# Patient Record
Sex: Male | Born: 1955 | Race: White | Hispanic: No | Marital: Married | Smoking: Never smoker
Health system: Southern US, Community
[De-identification: ages and names within clinical notes are randomized; demographics above are authoritative.]

## PROBLEM LIST (undated history)

## (undated) DIAGNOSIS — Z808 Family history of malignant neoplasm of other organs or systems: Secondary | ICD-10-CM

## (undated) DIAGNOSIS — M1711 Unilateral primary osteoarthritis, right knee: Secondary | ICD-10-CM

## (undated) DIAGNOSIS — E119 Type 2 diabetes mellitus without complications: Secondary | ICD-10-CM

## (undated) DIAGNOSIS — N200 Calculus of kidney: Secondary | ICD-10-CM

## (undated) DIAGNOSIS — M7710 Lateral epicondylitis, unspecified elbow: Secondary | ICD-10-CM

## (undated) DIAGNOSIS — Z800 Family history of malignant neoplasm of digestive organs: Secondary | ICD-10-CM

## (undated) DIAGNOSIS — M1990 Unspecified osteoarthritis, unspecified site: Secondary | ICD-10-CM

## (undated) DIAGNOSIS — E291 Testicular hypofunction: Secondary | ICD-10-CM

## (undated) DIAGNOSIS — E7800 Pure hypercholesterolemia, unspecified: Secondary | ICD-10-CM

## (undated) DIAGNOSIS — H9191 Unspecified hearing loss, right ear: Secondary | ICD-10-CM

## (undated) DIAGNOSIS — E663 Overweight: Secondary | ICD-10-CM

## (undated) DIAGNOSIS — I10 Essential (primary) hypertension: Secondary | ICD-10-CM

## (undated) HISTORY — DX: Unilateral primary osteoarthritis, right knee: M17.11

## (undated) HISTORY — DX: Unspecified hearing loss, right ear: H91.91

## (undated) HISTORY — DX: Essential (primary) hypertension: I10

## (undated) HISTORY — DX: Testicular hypofunction: E29.1

## (undated) HISTORY — DX: Family history of malignant neoplasm of other organs or systems: Z80.8

## (undated) HISTORY — PX: SHOULDER SURGERY: SHX246

## (undated) HISTORY — PX: LITHOTRIPSY: SUR834

## (undated) HISTORY — DX: Calculus of kidney: N20.0

## (undated) HISTORY — PX: ANAL FISSURE REPAIR: SHX2312

## (undated) HISTORY — DX: Family history of malignant neoplasm of digestive organs: Z80.0

## (undated) HISTORY — DX: Pure hypercholesterolemia, unspecified: E78.00

## (undated) HISTORY — DX: Unspecified osteoarthritis, unspecified site: M19.90

## (undated) HISTORY — DX: Type 2 diabetes mellitus without complications: E11.9

## (undated) HISTORY — DX: Overweight: E66.3

## (undated) HISTORY — DX: Lateral epicondylitis, unspecified elbow: M77.10

## (undated) HISTORY — PX: EYE SURGERY: SHX253

---

## 1988-12-17 HISTORY — PX: INGUINAL HERNIA REPAIR: SUR1180

## 1990-12-17 HISTORY — PX: LUNG REMOVAL, PARTIAL: SHX233

## 2005-07-20 ENCOUNTER — Ambulatory Visit: Payer: Self-pay | Admitting: General Surgery

## 2005-09-20 ENCOUNTER — Ambulatory Visit: Payer: Self-pay | Admitting: Urology

## 2011-08-20 ENCOUNTER — Emergency Department: Payer: Self-pay | Admitting: Unknown Physician Specialty

## 2015-08-17 ENCOUNTER — Other Ambulatory Visit: Payer: Self-pay | Admitting: Physician Assistant

## 2015-08-17 DIAGNOSIS — R319 Hematuria, unspecified: Secondary | ICD-10-CM

## 2015-08-17 DIAGNOSIS — R109 Unspecified abdominal pain: Secondary | ICD-10-CM

## 2015-08-17 DIAGNOSIS — Z87442 Personal history of urinary calculi: Secondary | ICD-10-CM

## 2015-08-18 ENCOUNTER — Ambulatory Visit
Admission: RE | Admit: 2015-08-18 | Discharge: 2015-08-18 | Disposition: A | Discharge status: Home / Self Care | Payer: BLUE CROSS/BLUE SHIELD | Source: Ambulatory Visit | Attending: Physician Assistant | Admitting: Physician Assistant

## 2015-08-18 DIAGNOSIS — N200 Calculus of kidney: Secondary | ICD-10-CM | POA: Insufficient documentation

## 2015-08-18 DIAGNOSIS — R319 Hematuria, unspecified: Secondary | ICD-10-CM | POA: Insufficient documentation

## 2015-08-18 DIAGNOSIS — K760 Fatty (change of) liver, not elsewhere classified: Secondary | ICD-10-CM | POA: Insufficient documentation

## 2015-08-18 DIAGNOSIS — Z87442 Personal history of urinary calculi: Secondary | ICD-10-CM

## 2015-08-18 DIAGNOSIS — R109 Unspecified abdominal pain: Secondary | ICD-10-CM | POA: Diagnosis present

## 2015-08-31 ENCOUNTER — Ambulatory Visit (INDEPENDENT_AMBULATORY_CARE_PROVIDER_SITE_OTHER): Payer: BLUE CROSS/BLUE SHIELD | Admitting: Obstetrics and Gynecology

## 2015-08-31 ENCOUNTER — Encounter: Payer: Self-pay | Admitting: Obstetrics and Gynecology

## 2015-08-31 ENCOUNTER — Ambulatory Visit: Payer: Self-pay

## 2015-08-31 VITALS — BP 155/91 | HR 65 | Ht 70.0 in | Wt 229.9 lb

## 2015-08-31 DIAGNOSIS — I159 Secondary hypertension, unspecified: Secondary | ICD-10-CM | POA: Diagnosis not present

## 2015-08-31 DIAGNOSIS — N200 Calculus of kidney: Secondary | ICD-10-CM

## 2015-08-31 LAB — URINALYSIS, COMPLETE
Bilirubin, UA: NEGATIVE
Glucose, UA: NEGATIVE
Ketones, UA: NEGATIVE
NITRITE UA: NEGATIVE
PH UA: 5 (ref 5.0–7.5)
Specific Gravity, UA: 1.025 (ref 1.005–1.030)
Urobilinogen, Ur: 0.2 mg/dL (ref 0.2–1.0)

## 2015-08-31 LAB — MICROSCOPIC EXAMINATION

## 2015-08-31 NOTE — Progress Notes (Signed)
08/31/2015 8:46 AM   Billy Scott 01/18/1956 [id number]  Referring provider: Jabier Mutton, MD [facility] [address] [place], [place] [address]  Chief Complaint  Patient presents with  . Nephrolithiasis    new pt    HPI: Billy Scott is a 59 year old male with a history of kidney stones presenting today with complaints of bilateral flank pain left greater than right and very dark urine. He was seen by the city nurse and a CT scan without contrast was ordered remarkable for a large left renal stone without signs of significant obstruction as well as a small 2 mm right upper pole calculus.  Patient denies any urinary symptoms except for extremely dark urine which I suspect is gross hematuria.  Patient states he is a previous patient of Billy Scott.  Stone history includes 3 prior episodes. He has required ESWL in the past.  No family history of stones. Patient reports very little if any daily water intake.   CT abd/pelvis wo 08/18/15 1.8 x 0.9 x 2.7 cm calculus in the left renal pelvis. There is some mild fullness of the left renal pelvis but no frank hydronephrosis. There is also some inflammatory changes in the peripelvic fat. 2. 2 mm nonobstructive calculus in the upper pole collecting system of the right kidney. 3. No ureteral stones or findings of urinary tract obstruction at this time.   PMH: Past Medical History  Diagnosis Date  . Kidney stone   . Arthritis   . Diabetes mellitus without complication   . Hypertension     Surgical History: Past Surgical History  Procedure Laterality Date  . Lung removal, partial Right 1992  . Inguinal hernia repair Right 1990  . Lithotripsy      Home Medications:    Medication List       This list is accurate as of: 08/31/15 11:59 PM.  Always use your most recent med list.               bisoprolol-hydrochlorothiazide 5-6.25 MG per tablet  Commonly known as:  ZIAC  Take 1 tablet by mouth daily.       KOMBIGLYZE XR 04-999 MG Tb24  Generic drug:  Saxagliptin-Metformin  Take 1 tablet by mouth every morning.        Allergies:  Allergies  Allergen Reactions  . Penicillins Hives    Family History: Family History  Problem Relation Age of Onset  . Cancer Father     esophageal  . Cancer Mother     liver  . Urolithiasis Neg Hx   . Prostate cancer Neg Hx   . Kidney disease Neg Hx   . Kidney cancer Neg Hx     Social History:  reports that he has never smoked. He does not have any smokeless tobacco history on file. He reports that he drinks about 0.6 oz of alcohol per week. He reports that he does not use illicit drugs.  ROS: UROLOGY Frequent Urination?: No Hard to postpone urination?: No Burning/pain with urination?: No Get up at night to urinate?: Yes Leakage of urine?: No Urine stream starts and stops?: No Trouble starting stream?: No Do you have to strain to urinate?: No Blood in urine?: Yes Urinary tract infection?: No Sexually transmitted disease?: No Injury to kidneys or bladder?: No Painful intercourse?: No Weak stream?: No Erection problems?: Yes Penile pain?: No  Gastrointestinal Nausea?: No Vomiting?: No Indigestion/heartburn?: No Diarrhea?: Yes Constipation?: No  Constitutional Fever: No Night sweats?: Yes  Weight loss?: No Fatigue?: No  Skin Skin rash/lesions?: No Itching?: No  Eyes Blurred vision?: No Double vision?: No  Ears/Nose/Throat Sore throat?: No Sinus problems?: No  Hematologic/Lymphatic Swollen glands?: No Easy bruising?: No  Cardiovascular Leg swelling?: No Chest pain?: No  Respiratory Cough?: No Shortness of breath?: No     Musculoskeletal Back pain?: No Joint pain?: Yes  Neurological Headaches?: No Dizziness?: No  Psychologic Depression?: No Anxiety?: No  Physical Exam: BP 155/91 mmHg  Pulse 65  Ht  (1.778 m)  Wt 229 lb 14.4 oz (104.282 kg)  BMI 32.99 kg/m2  Constitutional:  Alert and  oriented, No acute distress. HEENT: [place] AT, moist mucus membranes.  Trachea midline, no masses. Cardiovascular: No clubbing, cyanosis, or edema. Respiratory: Normal respiratory effort, no increased work of breathing. GI: Abdomen is soft, nontender, nondistended, no abdominal masses GU: Mild left CVA tenderness.  Skin: No rashes, bruises or suspicious lesions. Lymph: No cervical or inguinal adenopathy. Neurologic: Grossly intact, no focal deficits, moving all 4 extremities. Psychiatric: Normal mood and affect.  Laboratory Data: Lab Results  Component Value Date   WBC 10.5 08/31/2015   HCT 43.2 08/31/2015    Lab Results  Component Value Date   CREATININE 0.97 08/31/2015    No results found for: PSA  No results found for: TESTOSTERONE  No results found for: HGBA1C  Urinalysis    Component Value Date/Time   GLUCOSEU Negative 08/31/2015 1013   BILIRUBINUR Negative 08/31/2015 1013   NITRITE Negative 08/31/2015 1013   LEUKOCYTESUR 1+* 08/31/2015 1013    Pertinent Imaging: CLINICAL DATA: 59 year old male with bilateral flank pain (left greater than right). Microhematuria.  EXAM: CT ABDOMEN AND PELVIS WITHOUT CONTRAST  TECHNIQUE: Multidetector CT imaging of the abdomen and pelvis was performed following the standard protocol without IV contrast.  COMPARISON: CT the abdomen and pelvis 08/20/2011.  FINDINGS: Lower chest: 5 mm subpleural nodule in the inferior aspect of the right lower lobe (image 9 of series 4), unchanged compared to prior study 08/20/2011, presumably a subpleural lymph node (considered benign).  Hepatobiliary: Diffuse low attenuation throughout the hepatic parenchyma, compatible with hepatic steatosis. No definite cystic or solid hepatic lesions are identified on today's noncontrast CT examination. The unenhanced appearance of the gallbladder is normal.  Pancreas: No pancreatic mass or peripancreatic inflammatory changes on today's  noncontrast CT examination.  Spleen: Unremarkable.  Adrenals/Urinary Tract: In the left renal pelvis there is a 1.8 x 0.9 x 2.7 cm calculus. This is associated with some mild fullness of the left renal pelvis, but no frank hydronephrosis. Slight haziness in the peripelvic fat suggests associated inflammation. 2 mm nonobstructive calculus in the upper pole collecting system of the right kidney. No additional calculi are identified along the course of either ureter or within the lumen of the urinary bladder. No hydroureteronephrosis. The unenhanced appearance of the kidneys and urinary bladder is otherwise normal. Bilateral adrenal glands are normal in appearance.  Stomach/Bowel: The unenhanced appearance of the stomach is normal. No pathologic dilatation of small bowel or colon. Normal appendix.  Vascular/Lymphatic: Minimal atherosclerotic calcifications are identified in the abdominal and pelvic vasculature, without evidence of aneurysm. No lymphadenopathy noted in the abdomen or pelvis on today's noncontrast CT examination.  Reproductive: Prostate gland seminal vesicles are unremarkable in appearance.  Other: Small left inguinal hernia containing fat. No significant volume of ascites. No pneumoperitoneum.  Musculoskeletal: There are no aggressive appearing lytic or blastic lesions noted in the visualized portions of the skeleton.  IMPRESSION:  1. 1.8 x 0.9 x 2.7 cm calculus in the left renal pelvis. There is some mild fullness of the left renal pelvis but no frank hydronephrosis. There is also some inflammatory changes in the peripelvic fat. 2. 2 mm nonobstructive calculus in the upper pole collecting system of the right kidney. 3. No ureteral stones or findings of urinary tract obstruction at this time. 4. Normal appendix. 5. Hepatic steatosis. 6. Additional incidental findings, as above.  Assessment & Plan:   1. Kidney stones We discussed various treatment  options including PCNL vs ESWL vs. ureteroscopy, laser lithotripsy, and stent. We discussed the risks and benefits of all including bleeding, infection, damage to surrounding structures, efficacy with need for possible further intervention, and need for temporary ureteral stent. Given the stones location and large size I recommend inpatient proceed with PCNL. Images were reviewed with Dr. Sherryl Barters and he is in agreement with plan. Will schedule left PCNL with cystoscopy and bilateral retrogrades. Patient to follow-up after surgery to discuss stone analysis and stone prevention strategies. - Urinalysis, Complete  2. Gross hematuria-  Cystoscopy with bilateral retrogrades to be performed during PCNL for completion of hematuria workup.  2. Prostate Cancer Screening- Will discuss prostate cancer screening once stone is addressed.   Return for f/u post op to discuss stone analysis.  Billy Lou, FNP  [facility] [address] [place], [place] [address] [phone]

## 2015-09-01 DIAGNOSIS — I10 Essential (primary) hypertension: Secondary | ICD-10-CM | POA: Insufficient documentation

## 2015-09-01 DIAGNOSIS — E119 Type 2 diabetes mellitus without complications: Secondary | ICD-10-CM | POA: Insufficient documentation

## 2015-09-01 LAB — BASIC METABOLIC PANEL
BUN / CREAT RATIO: 15 (ref 9–20)
BUN: 15 mg/dL (ref 6–24)
CALCIUM: 9.8 mg/dL (ref 8.7–10.2)
CHLORIDE: 98 mmol/L (ref 97–108)
CO2: 24 mmol/L (ref 18–29)
Creatinine, Ser: 0.97 mg/dL (ref 0.76–1.27)
GFR, EST AFRICAN AMERICAN: 98 mL/min/{1.73_m2} (ref >59)
GFR, EST NON AFRICAN AMERICAN: 85 mL/min/{1.73_m2} (ref >59)
Glucose: 222 mg/dL — ABNORMAL HIGH (ref 65–99)
POTASSIUM: 4.7 mmol/L (ref 3.5–5.2)
Sodium: 140 mmol/L (ref 134–144)

## 2015-09-01 LAB — CBC WITH DIFFERENTIAL/PLATELET
BASOS: 0 %
Basophils Absolute: 0 10*3/uL (ref 0.0–0.2)
EOS (ABSOLUTE): 0.2 10*3/uL (ref 0.0–0.4)
Eos: 2 %
HEMOGLOBIN: 15 g/dL (ref 12.6–17.7)
Hematocrit: 43.2 % (ref 37.5–51.0)
IMMATURE GRANS (ABS): 0 10*3/uL (ref 0.0–0.1)
Immature Granulocytes: 0 %
LYMPHS ABS: 2 10*3/uL (ref 0.7–3.1)
LYMPHS: 19 %
MCH: 28.2 pg (ref 26.6–33.0)
MCHC: 34.7 g/dL (ref 31.5–35.7)
MCV: 81 fL (ref 79–97)
MONOCYTES: 8 %
Monocytes Absolute: 0.9 10*3/uL (ref 0.1–0.9)
NEUTROS ABS: 7.4 10*3/uL — AB (ref 1.4–7.0)
Neutrophils: 71 %
Platelets: 213 10*3/uL (ref 150–379)
RBC: 5.31 x10E6/uL (ref 4.14–5.80)
RDW: 13.7 % (ref 12.3–15.4)
WBC: 10.5 10*3/uL (ref 3.4–10.8)

## 2015-09-02 ENCOUNTER — Telehealth: Payer: Self-pay

## 2015-09-02 NOTE — Telephone Encounter (Signed)
-----   Message from Fernanda Drum, FNP sent at 09/01/2015  1:01 PM EDT ----- Please notify patient that his lab results came back within normal limits. Please ask him if he knows when he will be back from vacation yet and we can begin scheduling his surgery. Thank you

## 2015-09-02 NOTE — Telephone Encounter (Signed)
LMOM-made pt aware of lab results. Requested pt return call in reference to vacation and surgery.

## 2015-09-02 NOTE — Telephone Encounter (Signed)
-----   Message from Lindsay C Overton, FNP sent at 09/01/2015  1:01 PM EDT ----- Please notify patient that his lab results came back within normal limits. Please ask him if he knows when he will be back from vacation yet and we can begin scheduling his surgery. Thank you 

## 2015-09-02 NOTE — Telephone Encounter (Signed)
Pt returned call stating he will be back from vacation around 10/10/15.

## 2015-09-06 ENCOUNTER — Telehealth: Payer: Self-pay

## 2015-09-06 NOTE — Telephone Encounter (Signed)
Pt wants a strong pain med called into [facility] in [place] to be able to take with him on his trip to [place] 10/3.  Requesting that nurse call him back with "probability of stone moving or if it's going to continue to lay low" based on the size of this stone.????

## 2015-09-09 ENCOUNTER — Other Ambulatory Visit: Payer: Self-pay

## 2015-09-09 DIAGNOSIS — N200 Calculus of kidney: Secondary | ICD-10-CM

## 2015-09-09 MED ORDER — HYDROCODONE-ACETAMINOPHEN 5-300 MG PO TABS: ORAL_TABLET | ORAL | Status: DC

## 2015-09-12 ENCOUNTER — Other Ambulatory Visit: Payer: Self-pay | Admitting: Urology

## 2015-09-12 DIAGNOSIS — N200 Calculus of kidney: Secondary | ICD-10-CM

## 2015-09-14 ENCOUNTER — Telehealth: Payer: Self-pay | Admitting: Radiology

## 2015-09-14 NOTE — Telephone Encounter (Signed)
Notified pt of surgery scheduled 10/24/15, pre-op appt on 10/11/15 :30, & pre-admit testing appt on 10/11/15 :30. Pt to arrive at [facility] at 6:30am day of surgery & be npo after mn day of surgery. Pt verbalizes understanding.

## 2015-10-06 ENCOUNTER — Other Ambulatory Visit: Payer: BLUE CROSS/BLUE SHIELD

## 2015-10-11 ENCOUNTER — Encounter: Payer: Self-pay | Admitting: Obstetrics and Gynecology

## 2015-10-11 ENCOUNTER — Ambulatory Visit (INDEPENDENT_AMBULATORY_CARE_PROVIDER_SITE_OTHER): Payer: BLUE CROSS/BLUE SHIELD | Admitting: Obstetrics and Gynecology

## 2015-10-11 ENCOUNTER — Encounter
Admission: RE | Admit: 2015-10-11 | Discharge: 2015-10-11 | Disposition: A | Discharge status: Home / Self Care | Payer: BLUE CROSS/BLUE SHIELD | Source: Ambulatory Visit | Attending: Urology | Admitting: Urology

## 2015-10-11 VITALS — BP 152/81 | HR 57 | Temp 97.5°F | Resp 16 | Ht 70.0 in | Wt 230.0 lb

## 2015-10-11 DIAGNOSIS — N200 Calculus of kidney: Secondary | ICD-10-CM | POA: Insufficient documentation

## 2015-10-11 DIAGNOSIS — R310 Gross hematuria: Secondary | ICD-10-CM | POA: Insufficient documentation

## 2015-10-11 DIAGNOSIS — Z01818 Encounter for other preprocedural examination: Secondary | ICD-10-CM | POA: Insufficient documentation

## 2015-10-11 LAB — URINALYSIS, COMPLETE
Bilirubin, UA: NEGATIVE
KETONES UA: NEGATIVE
LEUKOCYTES UA: NEGATIVE
Nitrite, UA: NEGATIVE
Protein, UA: NEGATIVE
SPEC GRAV UA: 1.02 (ref 1.005–1.030)
Urobilinogen, Ur: 0.2 mg/dL (ref 0.2–1.0)
pH, UA: 5 (ref 5.0–7.5)

## 2015-10-11 LAB — BASIC METABOLIC PANEL
ANION GAP: 9 (ref 5–15)
BUN: 15 mg/dL (ref 6–20)
CALCIUM: 9.2 mg/dL (ref 8.9–10.3)
CO2: 26 mmol/L (ref 22–32)
Chloride: 104 mmol/L (ref 101–111)
Creatinine, Ser: 1.08 mg/dL (ref 0.61–1.24)
Glucose, Bld: 229 mg/dL — ABNORMAL HIGH (ref 65–99)
Potassium: 4 mmol/L (ref 3.5–5.1)
Sodium: 139 mmol/L (ref 135–145)

## 2015-10-11 LAB — MICROSCOPIC EXAMINATION
Epithelial Cells (non renal): NONE SEEN /hpf (ref 0–10)
RENAL EPITHEL UA: NONE SEEN /HPF

## 2015-10-11 LAB — URINALYSIS COMPLETE WITH MICROSCOPIC (ARMC ONLY)
BILIRUBIN URINE: NEGATIVE
Bacteria, UA: NONE SEEN
GLUCOSE, UA: 150 mg/dL — AB
KETONES UR: NEGATIVE mg/dL
LEUKOCYTES UA: NEGATIVE
NITRITE: NEGATIVE
Protein, ur: NEGATIVE mg/dL
SPECIFIC GRAVITY, URINE: 1.019 (ref 1.005–1.030)
Squamous Epithelial / LPF: NONE SEEN
pH: 5 (ref 5.0–8.0)

## 2015-10-11 NOTE — Patient Instructions (Signed)
  Your procedure is scheduled on: October 24, 2015(Monday) Report to Day Surgery.([facility]) To find out your arrival time please call [phone] between 1PM - 3PM on October 21, 2015 (Friday) Remember: Instructions that are not followed completely may result in serious medical risk, up to and including death, or upon the discretion of your surgeon and anesthesiologist your surgery may need to be rescheduled.    __X__ 1. Do not eat food or drink liquids after midnight. No gum chewing or hard candies.     __X__ 2. No Alcohol for 24 hours before or after surgery.   ____ 3. Bring all medications with you on the day of surgery if instructed.    ___x_ 4. Notify your doctor if there is any change in your medical condition     (cold, fever, infections).     Do not wear jewelry, make-up, hairpins, clips or nail polish.  Do not wear lotions, powders, or perfumes. You may wear deodorant.  Do not shave 48 hours prior to surgery. Men may shave face and neck.  Do not bring valuables to the hospital.    [facility] is not responsible for any belongings or valuables.               Contacts, dentures or bridgework may not be worn into surgery.  Leave your suitcase in the car. After surgery it may be brought to your room.  For patients admitted to the hospital, discharge time is determined by your                treatment team.   Patients discharged the day of surgery will not be allowed to drive home.   Please read over the following fact sheets that you were given:      ____ Take these medicines the morning of surgery with A SIP OF WATER:    1.   2.   3.   4.  5.  6.  ____ Fleet Enema (as directed)   ____ Use CHG Soap as directed  ____ Use inhalers on the day of surgery  __x__ Stop metformin 2 days prior to surgery (Stop Kombiglyze on November 5)    ____ Take 1/2 of usual insulin dose the night before surgery and none on the morning of surgery.   ____ Stop  Coumadin/Plavix/aspirin on   ____ Stop Anti-inflammatories on    ____ Stop supplements until after surgery.    ____ Bring C-Pap to the hospital.

## 2015-10-11 NOTE — Progress Notes (Signed)
10/11/2015 12:37 PM   Billy Scott March 31, 1956 [id number]  Referring provider: Jabier Mutton, MD [facility] [address] [place], [place] [address]  Chief Complaint  Patient presents with  . Nephrolithiasis  . Pre-op Exam    L PCNL, CYSTO W/ B RETROGRADES    HPI: Patient is a 59 year old male presenting today for his preoperative appointment for PCNL for removal of large left renal calculus. He reports feeling well today. No significant pain or fevers.  Previous Complaint History: [name] is a 59 year old male with a history of kidney stones presenting today with complaints of bilateral flank pain left greater than right and very dark urine. He was seen by the city nurse and a CT scan without contrast was ordered remarkable for a large left renal stone without signs of significant obstruction as well as a small 2 mm right upper pole calculus. Patient denies any urinary symptoms except for extremely dark urine which I suspect is gross hematuria.  Patient states he is a previous patient of Dr. Sheppard Penton. Stone history includes 3 prior episodes. He has required ESWL in the past.  No family history of stones. Patient reports very little if any daily water intake.  CT abd/pelvis wo 08/18/15 1.8 x 0.9 x 2.7 cm calculus in the left renal pelvis. There is some mild fullness of the left renal pelvis but no frank hydronephrosis. There is also some inflammatory changes in the peripelvic fat. 2. 2 mm nonobstructive calculus in the upper pole collecting system of the right kidney. 3. No ureteral stones or findings of urinary tract obstruction at this time.     PMH: Past Medical History  Diagnosis Date  . Arthritis   . Diabetes mellitus without complication (HCC)   . Hypertension   . Kidney stone     Surgical History: Past Surgical History  Procedure Laterality Date  . 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 Lithotripsy    . Inguinal  hernia repair Right 1990  . Eye surgery      Home Medications:    Medication List       This list is accurate as of: 10/11/15 12:37 PM.  Always use your most recent med list.               bisoprolol-hydrochlorothiazide 5-6.25 MG tablet  Commonly known as:  ZIAC  Take 1 tablet by mouth daily.     Hydrocodone-Acetaminophen 5-300 MG Tabs  Commonly known as:  VICODIN  1-2 tabs every 4-6 hours for pain     KOMBIGLYZE XR 04-999 MG Tb24  Generic drug:  Saxagliptin-Metformin  Take 1 tablet by mouth every morning.        Allergies:  Allergies  Allergen Reactions  . Penicillins Hives    Family History: Family History  Problem Relation Age of Onset  . Cancer Father     esophageal  . Cancer Mother     liver  . Urolithiasis Neg Hx   . Prostate cancer Neg Hx   . Kidney disease Neg Hx   . Kidney cancer Neg Hx     Social History:  reports that he has never smoked. He does not have any smokeless tobacco history on file. He reports that he drinks about 0.6 oz of alcohol per week. He reports that he does not use illicit drugs.  ROS: UROLOGY Frequent Urination?: No Hard to postpone urination?: No Burning/pain with urination?: No Get up at night to  urinate?: No Leakage of urine?: No Urine stream starts and stops?: No Trouble starting stream?: No Do you have to strain to urinate?: No Blood in urine?: No Urinary tract infection?: No Sexually transmitted disease?: No Injury to kidneys or bladder?: No Painful intercourse?: No Weak stream?: No Erection problems?: No Penile pain?: No  Gastrointestinal Nausea?: No Vomiting?: No Indigestion/heartburn?: No Diarrhea?: No Constipation?: No  Constitutional Fever: No Night sweats?: No Weight loss?: No Fatigue?: No  Skin Skin rash/lesions?: No Itching?: No  Eyes Blurred vision?: No Double vision?: No  Ears/Nose/Throat Sore throat?: No Sinus problems?: No  Hematologic/Lymphatic Swollen glands?: No Easy  bruising?: No  Cardiovascular Leg swelling?: No Chest pain?: No  Respiratory Cough?: No Shortness of breath?: No  Endocrine Excessive thirst?: No  Musculoskeletal Back pain?: No Joint pain?: No  Neurological Headaches?: No Dizziness?: No  Psychologic Depression?: No Anxiety?: No  Physical Exam: BP 152/81 mmHg  Pulse 57  Temp(Src) 97.5 F (36.4 C)  Resp 16  Ht 5\' 10"  (1.778 m)  Wt 230 lb (104.327 kg)  BMI 33.00 kg/m2  Constitutional:  Alert and oriented, No acute distress. HEENT: [place] AT, moist mucus membranes.  Trachea midline, no masses. Cardiovascular: No clubbing, cyanosis, or edema, RRR Respiratory: Normal respiratory effort, no increased work of breathing, CTAB GI: Abdomen is soft, nontender, nondistended, no abdominal masses GU: No CVA tenderness.  Skin: No rashes, bruises or suspicious lesions. Lymph: No cervical or inguinal adenopathy. Neurologic: Grossly intact, no focal deficits, moving all 4 extremities. Psychiatric: Normal mood and affect.  Laboratory Data:   Urinalysis    Component Value Date/Time   GLUCOSEU Negative 08/31/2015 1013   BILIRUBINUR Negative 08/31/2015 1013   NITRITE Negative 08/31/2015 1013   LEUKOCYTESUR 1+* 08/31/2015 1013    Pertinent Imaging: CLINICAL DATA: 59 year old male with bilateral flank pain (left greater than right). Microhematuria.  EXAM: CT ABDOMEN AND PELVIS WITHOUT CONTRAST  TECHNIQUE: Multidetector CT imaging of the abdomen and pelvis was performed following the standard protocol without IV contrast.  COMPARISON: CT the abdomen and pelvis 08/20/2011.  FINDINGS: Lower chest: 5 mm subpleural nodule in the inferior aspect of the right lower lobe (image 9 of series 4), unchanged compared to prior study 08/20/2011, presumably a subpleural lymph node (considered benign).  Hepatobiliary: Diffuse low attenuation throughout the hepatic parenchyma, compatible with hepatic steatosis. No definite  cystic or solid hepatic lesions are identified on today's noncontrast CT examination. The unenhanced appearance of the gallbladder is normal.  Pancreas: No pancreatic mass or peripancreatic inflammatory changes on today's noncontrast CT examination.  Spleen: Unremarkable.  Adrenals/Urinary Tract: In the left renal pelvis there is a 1.8 x 0.9 x 2.7 cm calculus. This is associated with some mild fullness of the left renal pelvis, but no frank hydronephrosis. Slight haziness in the peripelvic fat suggests associated inflammation. 2 mm nonobstructive calculus in the upper pole collecting system of the right kidney. No additional calculi are identified along the course of either ureter or within the lumen of the urinary bladder. No hydroureteronephrosis. The unenhanced appearance of the kidneys and urinary bladder is otherwise normal. Bilateral adrenal glands are normal in appearance.  Stomach/Bowel: The unenhanced appearance of the stomach is normal. No pathologic dilatation of small bowel or colon. Normal appendix.  Vascular/Lymphatic: Minimal atherosclerotic calcifications are identified in the abdominal and pelvic vasculature, without evidence of aneurysm. No lymphadenopathy noted in the abdomen or pelvis on today's noncontrast CT examination.  Reproductive: Prostate gland seminal vesicles are unremarkable in appearance.  Other:  Small left inguinal hernia containing fat. No significant volume of ascites. No pneumoperitoneum.  Musculoskeletal: There are no aggressive appearing lytic or blastic lesions noted in the visualized portions of the skeleton.  IMPRESSION: 1. 1.8 x 0.9 x 2.7 cm calculus in the left renal pelvis. There is some mild fullness of the left renal pelvis but no frank hydronephrosis. There is also some inflammatory changes in the peripelvic fat. 2. 2 mm nonobstructive calculus in the upper pole collecting system of the right kidney. 3. No ureteral  stones or findings of urinary tract obstruction at this time. 4. Normal appendix. 5. Hepatic steatosis. 6. Additional incidental findings, as above.  Assessment & Plan:    1. Kidney stones- Risks and benefits of PCNL reviewed with patient.  Patient's questions were answered and he is willing to proceed with surgery. - Urinalysis, Complete -Urine Culture  2. Gross hematuria- Cystoscopy with bilateral retrogrades to be performed during PCNL for completion of hematuria workup.   There are no diagnoses linked to this encounter.  No Follow-up on file.  These notes generated with voice recognition software. I apologize for typographical errors.  Earlie Lou, FNP  [facility] [address] [place], [place] [address] [phone]

## 2015-10-13 LAB — CULTURE, URINE COMPREHENSIVE

## 2015-10-13 LAB — URINE CULTURE

## 2015-10-17 ENCOUNTER — Other Ambulatory Visit: Payer: Self-pay | Admitting: Radiology

## 2015-10-17 ENCOUNTER — Ambulatory Visit: Payer: BLUE CROSS/BLUE SHIELD

## 2015-10-18 ENCOUNTER — Other Ambulatory Visit: Payer: Self-pay | Admitting: Obstetrics and Gynecology

## 2015-10-18 DIAGNOSIS — N200 Calculus of kidney: Secondary | ICD-10-CM

## 2015-10-19 ENCOUNTER — Other Ambulatory Visit (HOSPITAL_COMMUNITY): Payer: Self-pay | Admitting: Diagnostic Radiology

## 2015-10-19 ENCOUNTER — Other Ambulatory Visit: Payer: Self-pay | Admitting: Urology

## 2015-10-19 DIAGNOSIS — N200 Calculus of kidney: Secondary | ICD-10-CM

## 2015-10-21 ENCOUNTER — Other Ambulatory Visit: Payer: Self-pay | Admitting: Radiology

## 2015-10-21 MED ORDER — SODIUM CHLORIDE 0.9 % IV SOLN
Freq: Once | INTRAVENOUS | Status: AC
  Administered 2015-10-24: 07:00:00 via INTRAVENOUS

## 2015-10-24 ENCOUNTER — Ambulatory Visit: Payer: BLUE CROSS/BLUE SHIELD

## 2015-10-24 ENCOUNTER — Encounter: Payer: Self-pay | Admitting: *Deleted

## 2015-10-24 ENCOUNTER — Ambulatory Visit: Payer: BLUE CROSS/BLUE SHIELD | Admitting: Certified Registered"

## 2015-10-24 ENCOUNTER — Telehealth: Payer: Self-pay

## 2015-10-24 ENCOUNTER — Ambulatory Visit
Admission: RE | Admit: 2015-10-24 | Discharge: 2015-10-24 | Disposition: A | Discharge status: Home / Self Care | Payer: BLUE CROSS/BLUE SHIELD | Source: Ambulatory Visit | Attending: Diagnostic Radiology | Admitting: Diagnostic Radiology

## 2015-10-24 ENCOUNTER — Telehealth: Payer: Self-pay | Admitting: Urology

## 2015-10-24 ENCOUNTER — Ambulatory Visit
Admission: RE | Admit: 2015-10-24 | Discharge: 2015-10-24 | Disposition: A | Discharge status: Home / Self Care | Payer: BLUE CROSS/BLUE SHIELD | Source: Ambulatory Visit | Attending: Urology | Admitting: Urology

## 2015-10-24 ENCOUNTER — Observation Stay
Admission: RE | Admit: 2015-10-24 | Discharge: 2015-10-25 | Disposition: A | Discharge status: Home / Self Care | Payer: BLUE CROSS/BLUE SHIELD | Source: Ambulatory Visit | Attending: Urology | Admitting: Urology

## 2015-10-24 ENCOUNTER — Encounter
Admission: RE | Disposition: A | Discharge status: Home / Self Care | Payer: Self-pay | Source: Ambulatory Visit | Attending: Urology

## 2015-10-24 DIAGNOSIS — N200 Calculus of kidney: Secondary | ICD-10-CM | POA: Diagnosis not present

## 2015-10-24 DIAGNOSIS — Z419 Encounter for procedure for purposes other than remedying health state, unspecified: Secondary | ICD-10-CM

## 2015-10-24 DIAGNOSIS — M1990 Unspecified osteoarthritis, unspecified site: Secondary | ICD-10-CM | POA: Diagnosis not present

## 2015-10-24 DIAGNOSIS — Z87442 Personal history of urinary calculi: Secondary | ICD-10-CM | POA: Insufficient documentation

## 2015-10-24 DIAGNOSIS — Z79899 Other long term (current) drug therapy: Secondary | ICD-10-CM | POA: Diagnosis not present

## 2015-10-24 DIAGNOSIS — I10 Essential (primary) hypertension: Secondary | ICD-10-CM | POA: Diagnosis not present

## 2015-10-24 DIAGNOSIS — R310 Gross hematuria: Secondary | ICD-10-CM | POA: Diagnosis present

## 2015-10-24 DIAGNOSIS — Z902 Acquired absence of lung [part of]: Secondary | ICD-10-CM | POA: Diagnosis not present

## 2015-10-24 DIAGNOSIS — Z880 Allergy status to penicillin: Secondary | ICD-10-CM | POA: Diagnosis not present

## 2015-10-24 DIAGNOSIS — E119 Type 2 diabetes mellitus without complications: Secondary | ICD-10-CM | POA: Insufficient documentation

## 2015-10-24 DIAGNOSIS — Z436 Encounter for attention to other artificial openings of urinary tract: Secondary | ICD-10-CM

## 2015-10-24 DIAGNOSIS — Z9889 Other specified postprocedural states: Secondary | ICD-10-CM | POA: Insufficient documentation

## 2015-10-24 HISTORY — PX: HOLMIUM LASER APPLICATION: SHX5852

## 2015-10-24 HISTORY — PX: NEPHROLITHOTOMY: SHX5134

## 2015-10-24 LAB — TYPE AND SCREEN
ABO/RH(D): O POS
Antibody Screen: NEGATIVE

## 2015-10-24 LAB — BASIC METABOLIC PANEL
Anion gap: 5 (ref 5–15)
BUN: 18 mg/dL (ref 6–20)
CALCIUM: 9.1 mg/dL (ref 8.9–10.3)
CO2: 27 mmol/L (ref 22–32)
CREATININE: 0.94 mg/dL (ref 0.61–1.24)
Chloride: 106 mmol/L (ref 101–111)
GFR calc non Af Amer: >60 mL/min (ref >60)
Glucose, Bld: 177 mg/dL — ABNORMAL HIGH (ref 65–99)
Potassium: 4.1 mmol/L (ref 3.5–5.1)
Sodium: 138 mmol/L (ref 135–145)

## 2015-10-24 LAB — CBC
HCT: 42.4 % (ref 40.0–52.0)
Hemoglobin: 14.4 g/dL (ref 13.0–18.0)
MCH: 27.8 pg (ref 26.0–34.0)
MCHC: 34 g/dL (ref 32.0–36.0)
MCV: 81.9 fL (ref 80.0–100.0)
PLATELETS: 215 10*3/uL (ref 150–440)
RBC: 5.18 MIL/uL (ref 4.40–5.90)
RDW: 13.5 % (ref 11.5–14.5)
WBC: 8.6 10*3/uL (ref 3.8–10.6)

## 2015-10-24 LAB — PROTIME-INR
INR: 0.95
PROTHROMBIN TIME: 12.9 s (ref 11.4–15.0)

## 2015-10-24 LAB — APTT: aPTT: 28 seconds (ref 24–36)

## 2015-10-24 LAB — GLUCOSE, CAPILLARY: Glucose-Capillary: 156 mg/dL — ABNORMAL HIGH (ref 65–99)

## 2015-10-24 LAB — ABO/RH: ABO/RH(D): O POS

## 2015-10-24 SURGERY — NEPHROLITHOTOMY PERCUTANEOUS
Anesthesia: General | Laterality: Left | Wound class: Clean Contaminated

## 2015-10-24 MED ORDER — ROCURONIUM BROMIDE 100 MG/10ML IV SOLN
INTRAVENOUS | Status: DC | PRN
  Administered 2015-10-24 (×2): 20 mg via INTRAVENOUS

## 2015-10-24 MED ORDER — ONDANSETRON HCL 4 MG/2ML IJ SOLN
INTRAMUSCULAR | Status: AC
  Filled 2015-10-24: qty 2

## 2015-10-24 MED ORDER — PROPOFOL 10 MG/ML IV BOLUS
INTRAVENOUS | Status: DC | PRN
  Administered 2015-10-24: 200 mg via INTRAVENOUS

## 2015-10-24 MED ORDER — PHENYLEPHRINE HCL 10 MG/ML IJ SOLN
INTRAMUSCULAR | Status: DC | PRN
  Administered 2015-10-24: 100 ug via INTRAVENOUS

## 2015-10-24 MED ORDER — HYOSCYAMINE SULFATE 0.125 MG SL SUBL
0.1250 mg | SUBLINGUAL_TABLET | SUBLINGUAL | Status: DC | PRN
  Administered 2015-10-24: 0.125 mg via SUBLINGUAL
  Filled 2015-10-24 (×2): qty 1

## 2015-10-24 MED ORDER — FAMOTIDINE 20 MG PO TABS
20.0000 mg | ORAL_TABLET | Freq: Once | ORAL | Status: AC
  Administered 2015-10-24: 20 mg via ORAL

## 2015-10-24 MED ORDER — ACETAMINOPHEN 325 MG PO TABS: 650.0000 mg | ORAL_TABLET | ORAL | Status: DC | PRN

## 2015-10-24 MED ORDER — OXYCODONE-ACETAMINOPHEN 5-325 MG PO TABS
1.0000 | ORAL_TABLET | ORAL | Status: DC | PRN
  Administered 2015-10-24: 1 via ORAL
  Filled 2015-10-24: qty 1

## 2015-10-24 MED ORDER — SODIUM CHLORIDE 0.9 % IV SOLN
INTRAVENOUS | Status: DC
  Administered 2015-10-24: 10:00:00 via INTRAVENOUS

## 2015-10-24 MED ORDER — ONDANSETRON HCL 4 MG/2ML IJ SOLN
4.0000 mg | INTRAMUSCULAR | Status: DC | PRN
  Administered 2015-10-24: 4 mg via INTRAVENOUS
  Filled 2015-10-24: qty 2

## 2015-10-24 MED ORDER — LIDOCAINE HCL (CARDIAC) 20 MG/ML IV SOLN
INTRAVENOUS | Status: DC | PRN
  Administered 2015-10-24: 100 mg via INTRAVENOUS

## 2015-10-24 MED ORDER — LIDOCAINE HCL (PF) 1 % IJ SOLN
INTRAMUSCULAR | Status: AC
  Filled 2015-10-24: qty 10

## 2015-10-24 MED ORDER — LIDOCAINE HCL (PF) 1 % IJ SOLN
INTRAMUSCULAR | Status: DC | PRN
  Administered 2015-10-24: 20 mL

## 2015-10-24 MED ORDER — SULFAMETHOXAZOLE-TRIMETHOPRIM 800-160 MG PO TABS
1.0000 | ORAL_TABLET | Freq: Two times a day (BID) | ORAL | Status: DC
  Administered 2015-10-24 – 2015-10-25 (×2): 1 via ORAL
  Filled 2015-10-24 (×2): qty 1

## 2015-10-24 MED ORDER — FAMOTIDINE 20 MG PO TABS
ORAL_TABLET | ORAL | Status: AC
  Administered 2015-10-24: 20 mg via ORAL
  Filled 2015-10-24: qty 1

## 2015-10-24 MED ORDER — LINAGLIPTIN 5 MG PO TABS
5.0000 mg | ORAL_TABLET | Freq: Every day | ORAL | Status: DC
  Administered 2015-10-25: 5 mg via ORAL
  Filled 2015-10-24 (×2): qty 1

## 2015-10-24 MED ORDER — MIDAZOLAM HCL 5 MG/5ML IJ SOLN
INTRAMUSCULAR | Status: AC | PRN
  Administered 2015-10-24: 2 mg via INTRAVENOUS
  Administered 2015-10-24 (×2): 0.5 mg via INTRAVENOUS

## 2015-10-24 MED ORDER — EPHEDRINE SULFATE 50 MG/ML IJ SOLN
INTRAMUSCULAR | Status: DC | PRN
Start: 2015-10-24 — End: 2015-10-24
  Administered 2015-10-24: 10 mg via INTRAVENOUS

## 2015-10-24 MED ORDER — HYDROMORPHONE HCL 1 MG/ML IJ SOLN
0.5000 mg | INTRAMUSCULAR | Status: DC | PRN
  Administered 2015-10-24: 1 mg via INTRAVENOUS
  Filled 2015-10-24: qty 1

## 2015-10-24 MED ORDER — HYDROMORPHONE HCL 1 MG/ML IJ SOLN
INTRAMUSCULAR | Status: AC
  Administered 2015-10-24: 0.5 mg via INTRAVENOUS
  Filled 2015-10-24: qty 1

## 2015-10-24 MED ORDER — METFORMIN HCL ER 500 MG PO TB24
1000.0000 mg | ORAL_TABLET | Freq: Every day | ORAL | Status: DC
  Administered 2015-10-25: 1000 mg via ORAL
  Filled 2015-10-24 (×3): qty 2

## 2015-10-24 MED ORDER — SUGAMMADEX SODIUM 200 MG/2ML IV SOLN
INTRAVENOUS | Status: DC | PRN
  Administered 2015-10-24: 200 mg via INTRAVENOUS

## 2015-10-24 MED ORDER — DOCUSATE SODIUM 100 MG PO CAPS
100.0000 mg | ORAL_CAPSULE | Freq: Two times a day (BID) | ORAL | Status: DC
  Administered 2015-10-25: 100 mg via ORAL
  Filled 2015-10-24: qty 1

## 2015-10-24 MED ORDER — BISOPROLOL-HYDROCHLOROTHIAZIDE 5-6.25 MG PO TABS
1.0000 | ORAL_TABLET | Freq: Every day | ORAL | Status: DC
  Administered 2015-10-25: 1 via ORAL
  Filled 2015-10-24 (×2): qty 1

## 2015-10-24 MED ORDER — IOHEXOL 300 MG/ML  SOLN
30.0000 mL | Freq: Once | INTRAMUSCULAR | Status: DC | PRN
  Administered 2015-10-24: 50 mL
  Filled 2015-10-24: qty 30

## 2015-10-24 MED ORDER — FENTANYL CITRATE (PF) 100 MCG/2ML IJ SOLN
INTRAMUSCULAR | Status: DC | PRN
  Administered 2015-10-24 (×2): 50 ug via INTRAVENOUS

## 2015-10-24 MED ORDER — MIDAZOLAM HCL 2 MG/2ML IJ SOLN
INTRAMUSCULAR | Status: DC | PRN
  Administered 2015-10-24: 2 mg via INTRAVENOUS

## 2015-10-24 MED ORDER — GLYCOPYRROLATE 0.2 MG/ML IJ SOLN
INTRAMUSCULAR | Status: DC | PRN
  Administered 2015-10-24: 0.2 mg via INTRAVENOUS

## 2015-10-24 MED ORDER — FENTANYL CITRATE (PF) 100 MCG/2ML IJ SOLN
INTRAMUSCULAR | Status: AC | PRN
  Administered 2015-10-24: 25 ug via INTRAVENOUS
  Administered 2015-10-24: 50 ug via INTRAVENOUS
  Administered 2015-10-24 (×2): 25 ug via INTRAVENOUS

## 2015-10-24 MED ORDER — CIPROFLOXACIN IN D5W 400 MG/200ML IV SOLN
400.0000 mg | INTRAVENOUS | Status: AC
  Administered 2015-10-24: 400 mg via INTRAVENOUS

## 2015-10-24 MED ORDER — DIPHENHYDRAMINE HCL 50 MG/ML IJ SOLN: 12.5000 mg | Freq: Four times a day (QID) | INTRAMUSCULAR | Status: DC | PRN

## 2015-10-24 MED ORDER — VANCOMYCIN HCL IN DEXTROSE 1-5 GM/200ML-% IV SOLN
INTRAVENOUS | Status: AC
  Filled 2015-10-24: qty 200

## 2015-10-24 MED ORDER — GENTAMICIN SULFATE 40 MG/ML IJ SOLN
425.0000 mg | Freq: Once | INTRAVENOUS | Status: AC
  Administered 2015-10-24: 430 mg via INTRAVENOUS
  Filled 2015-10-24: qty 10.75

## 2015-10-24 MED ORDER — ONDANSETRON HCL 4 MG/2ML IJ SOLN
4.0000 mg | Freq: Once | INTRAMUSCULAR | Status: AC | PRN
  Administered 2015-10-24: 4 mg via INTRAVENOUS

## 2015-10-24 MED ORDER — SODIUM CHLORIDE 0.9 % IV SOLN
INTRAVENOUS | Status: DC
  Administered 2015-10-24 – 2015-10-25 (×4): via INTRAVENOUS

## 2015-10-24 MED ORDER — VANCOMYCIN HCL 10 G IV SOLR
1500.0000 mg | Freq: Once | INTRAVENOUS | Status: AC
  Administered 2015-10-24: 1500 mg via INTRAVENOUS
  Filled 2015-10-24: qty 1500

## 2015-10-24 MED ORDER — ZOLPIDEM TARTRATE 5 MG PO TABS: 5.0000 mg | ORAL_TABLET | Freq: Every evening | ORAL | Status: DC | PRN

## 2015-10-24 MED ORDER — SAXAGLIPTIN-METFORMIN ER 5-1000 MG PO TB24: 1.0000 | ORAL_TABLET | Freq: Every morning | ORAL | Status: DC

## 2015-10-24 MED ORDER — SUCCINYLCHOLINE CHLORIDE 20 MG/ML IJ SOLN
INTRAMUSCULAR | Status: DC | PRN
  Administered 2015-10-24: 100 mg via INTRAVENOUS

## 2015-10-24 MED ORDER — BACITRACIN-NEOMYCIN-POLYMYXIN 400-5-5000 EX OINT
1.0000 "application " | TOPICAL_OINTMENT | Freq: Three times a day (TID) | CUTANEOUS | Status: DC | PRN
  Filled 2015-10-24: qty 1

## 2015-10-24 MED ORDER — DIPHENHYDRAMINE HCL 12.5 MG/5ML PO ELIX
12.5000 mg | ORAL_SOLUTION | Freq: Four times a day (QID) | ORAL | Status: DC | PRN
Start: 2015-10-24 — End: 2015-10-25

## 2015-10-24 MED ORDER — HYDROMORPHONE HCL 1 MG/ML IJ SOLN
0.2500 mg | INTRAMUSCULAR | Status: DC | PRN
  Administered 2015-10-24 (×4): 0.5 mg via INTRAVENOUS

## 2015-10-24 MED ORDER — ONDANSETRON HCL 4 MG/2ML IJ SOLN
INTRAMUSCULAR | Status: DC | PRN
  Administered 2015-10-24: 4 mg via INTRAVENOUS

## 2015-10-24 MED ORDER — IOTHALAMATE MEGLUMINE 43 % IV SOLN
INTRAVENOUS | Status: DC | PRN
  Administered 2015-10-24: 20 mL

## 2015-10-24 SURGICAL SUPPLY — 63 items
ADAPTER IRRIG TUBE 2 SPIKE SOL (ADAPTER) ×6 IMPLANT
ADAPTER SCOPE UROLOK II (MISCELLANEOUS) ×3 IMPLANT
ADPR INSRT BALL FIT URLK2 (MISCELLANEOUS) ×1
ADPR TBG 2 SPK PMP STRL ASCP (ADAPTER) ×2
BAG URO DRAIN 2000ML W/SPOUT (MISCELLANEOUS) ×3 IMPLANT
BALLN NEPHROMAX 24X8X12 (CATHETERS) ×3
BALLOON NEPHROMAX 24X8X12 (CATHETERS) ×1 IMPLANT
BLADE SURG 15 STRL LF DISP TIS (BLADE) ×1 IMPLANT
BLADE SURG 15 STRL SS (BLADE) ×3
CATH BRONCH ASP 14F (CATHETERS) ×1 IMPLANT
CATH COUNCIL 22FR (CATHETERS) ×1 IMPLANT
CATH STENT KAYE NEPHR TAMP (CATHETERS) ×1 IMPLANT
CATH TRAY 16F METER LATEX (MISCELLANEOUS) ×3 IMPLANT
CATH URETL 5X70 OPEN END (CATHETERS) ×3 IMPLANT
CATH/STENT KAYE NEPHR TAMP (CATHETERS) ×3
CHLORAPREP W/TINT 26ML (MISCELLANEOUS) ×3 IMPLANT
CNTNR SPEC 2.5X3XGRAD LEK (MISCELLANEOUS) ×1
CONRAY 43 FOR UROLOGY 50M (MISCELLANEOUS) ×12 IMPLANT
CONT SPEC 4OZ STER OR WHT (MISCELLANEOUS) ×2
CONT SPEC 4OZ STRL OR WHT (MISCELLANEOUS) ×1
CONTAINER SPEC 2.5X3XGRAD LEK (MISCELLANEOUS) ×1 IMPLANT
DEVICE INFLATION 26 ENCORE (MISCELLANEOUS) ×3 IMPLANT
DRAPE C-ARM XRAY 36X54 (DRAPES) ×3 IMPLANT
DRAPE SHEET LG 3/4 BI-LAMINATE (DRAPES) ×3 IMPLANT
DRAPE SURG 17X11 SM STRL (DRAPES) ×12 IMPLANT
GAUZE SPONGE 4X4 12PLY STRL (GAUZE/BANDAGES/DRESSINGS) ×3 IMPLANT
GLIDEWIRE STIFF .35X180X3 HYDR (WIRE) ×2 IMPLANT
GLOVE BIO SURGEON STRL SZ 6.5 (GLOVE) ×4 IMPLANT
GLOVE BIO SURGEON STRL SZ7 (GLOVE) ×6 IMPLANT
GLOVE BIO SURGEONS STRL SZ 6.5 (GLOVE) ×2
GLOVE BIOGEL PI IND STRL 6.5 (GLOVE) ×2 IMPLANT
GLOVE BIOGEL PI INDICATOR 6.5 (GLOVE) ×4
GOWN STRL REUS W/ TWL LRG LVL3 (GOWN DISPOSABLE) ×2 IMPLANT
GOWN STRL REUS W/TWL LRG LVL3 (GOWN DISPOSABLE) ×6
GUIDEWIRE GREEN .038 145CM (MISCELLANEOUS) ×3 IMPLANT
GUIDEWIRE STR ZIPWIRE 035X150 (MISCELLANEOUS) ×3 IMPLANT
GUIDEWIRE SUPER STIFF .035X180 (WIRE) ×3 IMPLANT
INTRODUCER DILATOR DOUBLE (INTRODUCER) ×3 IMPLANT
MANIFOLD NEPTUNE II (INSTRUMENTS) ×3 IMPLANT
NDL FASCIA INCISION 18GA (NEEDLE) ×3 IMPLANT
PACK BASIN MINOR ARMC (MISCELLANEOUS) ×3 IMPLANT
PAD ABD DERMACEA PRESS 5X9 (GAUZE/BANDAGES/DRESSINGS) ×1 IMPLANT
PROBE CYBERWAND SET (MISCELLANEOUS) ×4 IMPLANT
SENSORWIRE 0.038 NOT ANGLED (WIRE) ×6
SET IRRIG Y TYPE TUR BLADDER L (SET/KITS/TRAYS/PACK) ×3 IMPLANT
SET IRRIGATING DISP (SET/KITS/TRAYS/PACK) ×3 IMPLANT
SHEET NEURO XL SOL CTL (MISCELLANEOUS) ×3 IMPLANT
SOL .9 NS 3000ML IRR  AL (IV SOLUTION) ×12
SOL .9 NS 3000ML IRR AL (IV SOLUTION) ×6
SOL .9 NS 3000ML IRR UROMATIC (IV SOLUTION) ×6 IMPLANT
SPONGE DRAIN TRACH 4X4 STRL 2S (GAUZE/BANDAGES/DRESSINGS) ×3 IMPLANT
STENT URET 6FRX28 CONTOUR (STENTS) ×2 IMPLANT
SUT SILK 0 SH 30 (SUTURE) ×3 IMPLANT
SYR 20CC LL (SYRINGE) ×3 IMPLANT
SYR 30ML LL (SYRINGE) ×3 IMPLANT
SYRINGE 10CC LL (SYRINGE) ×3 IMPLANT
SYRINGE IRR TOOMEY STRL 70CC (SYRINGE) ×3 IMPLANT
TAPE MICROFOAM 4IN (TAPE) ×3 IMPLANT
TRAP SPECIMEN MUCOUS 40CC (MISCELLANEOUS) ×1 IMPLANT
TUBING CONNECTING 10 (TUBING) ×4 IMPLANT
TUBING CONNECTING 10' (TUBING) ×2
WATER STERILE IRR 1000ML POUR (IV SOLUTION) ×3 IMPLANT
WIRE SENSOR 0.038 NOT ANGLED (WIRE) ×2 IMPLANT

## 2015-10-24 NOTE — Transfer of Care (Signed)
Immediate Anesthesia Transfer of Care Note  Patient: Billy HashimotoKeith W Mabee  Procedure(s) Performed: Procedure(s): NEPHROLITHOTOMY PERCUTANEOUS (Left) HOLMIUM LASER APPLICATION (Left)  Patient Location: PACU  Anesthesia Type:General  Level of Consciousness: awake, alert , oriented and patient cooperative  Airway & Oxygen Therapy: Patient Spontanous Breathing and Patient connected to face mask oxygen  Post-op Assessment: Report given to RN, Post -op Vital signs reviewed and stable and Patient moving all extremities X 4  Post vital signs: Reviewed and stable  Last Vitals:  Filed Vitals:   10/24/15 0948  BP: 116/75  Pulse: 42  Temp: 36.4 C  Resp: 15    Complications: No apparent anesthesia complications

## 2015-10-24 NOTE — Telephone Encounter (Signed)
Patient needs an office for a cystoscopy with stent removal in two weeks with one of the physicians.

## 2015-10-24 NOTE — Op Note (Signed)
Preoperative diagnosis: Left renal pelvic stone Postoperative diagnosis: Same  Procedure: Left PCNL 3 cm, tubeless (6 x 28 cm left ureteral stent in place); antegrade pyelogram  Surgeon: Junious Silk  Anesthesia: Gen.  Indication for procedure: 59 year old with symptomatic 3 cm left renal pelvic stone. He was brought for definitive stone management after undergoing nephroureteral access this morning.  Findings: On nephroscopy a large golden stone was noted in the renal pelvis. Faintly visible on fluoroscopy.  Antegrade pyelogram-this outlined a normal collecting system, renal pelvis and ureter without extravasation except for some backflow out of the lower calyx into the per tract. A stent coil was noted in the renal pelvis.  Description of procedure: After consent was obtained the patient was brought to the operating room. After adequate anesthesia a Foley catheter was placed. He was placed prone and all pressure points and joints were positioned and aligned properly. The left flank and nephroureteral tube were prepped and draped in the usual sterile fashion. A superstiff wire was passed down the nephroureteral access and coiled in the bladder. The nephroureteral stent was removed and the skin incised. A dual-lumen exchange catheter was advanced and a Glidewire was advanced and also coiled in the bladder. An antegrade nephrostogram was obtained which outlined the collecting system and pelvic stone. The dual-lumen was removed. The balloon dilator was passed inflated to 14 atm and the access sheath advanced. Initial nephroscopy noted it to be a touch shallow therefore the balloon was repassed a couple of centimeters deeper reinflated and the access sheath moved and accordingly. The balloon was deflated and removed and the nephroscope was advanced where the stone was encountered at the lower, posterior end of the renal pelvis. Both wires were visualized. The stone was fragmented with the cyber wand  without difficulty. A couple of pieces were grasped and removed but most of it was completely fragmented and evacuated. On CT the stone was noted not to progress significantly down any infundibulum therefore I felt all the stone had been removed after getting the very distal, superior aspect of the upper pole infundibulum.  Bleeding had been on the very minimal end of the spectrum, therefore I decided to perform a tubeless procedure. The superstiff wire was backloaded on the nephroscope and a 6 x 28 cm stent was advanced. The wire was removed with a good coil seen in the bladder and a good coil in the renal pelvis on renoscopy. Also, there continued to be minimal bleeding with no clot in the renal pelvis. The scope and access sheath were then removed without difficulty. Over the remaining Glidewire the dual-lumen was repassed into the lower pole calyx. Antegrade nephrostogram was performed which showed no extravasation except along the dual-lumen through the per tract. The renal pelvis and collecting system and ureter were all intact. Therefore the dual-lumen was backed out and the Glidewire removed. There again remain no bleeding from the small flank incision. It was closed with interrupted 3-0 chromic sutures. A sterile dressing was applied. The patient was placed supine awakened and taken to the recovery room in stable condition.  Complications: None  Blood loss: Minimal  Drains: 6 x 28 cm left ureteral stent, 16 French Foley catheter  Specimens: Stones to lab  Disposition: Patient stable to PACU

## 2015-10-24 NOTE — H&P (Signed)
Chief Complaint: Patient was seen in consultation today for percutaneous nephrolithotomy procedure.  History of Present Illness: Billy Scott is a 59 y.o. male with history of kidney stones and bilateral flank pain.  Patient has a 2.7 cm calculus in left renal pelvis and scheduled for percutaneous nephrolithotomy procedure.  Main complaints are bilateral flank pain and hematuria.  Denies chest pain, shortness of breath, abdominal pain, fevers, chills or bowel issues.    Past Medical History  Diagnosis Date  . Arthritis   . Diabetes mellitus without complication (HCC)   . Hypertension   . Kidney stone     Past Surgical History  Procedure Laterality Date  . 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 Lithotripsy    . Inguinal hernia repair Right 1990  . Eye surgery      Allergies: Penicillins  Medications: Prior to Admission medications   Medication Sig Start Date End Date Taking? Authorizing Provider  bisoprolol-hydrochlorothiazide (ZIAC) 5-6.25 MG per tablet Take 1 tablet by mouth daily. 08/10/15  Yes Historical Provider, MD  Hydrocodone-Acetaminophen (VICODIN) 5-300 MG TABS 1-2 tabs every 4-6 hours for pain Patient not taking: Reported on 10/24/2015 09/09/15   Fernanda Drum, FNP  KOMBIGLYZE XR 04-999 MG TB24 Take 1 tablet by mouth every morning.  08/08/15   Historical Provider, MD     Family History  Problem Relation Age of Onset  . Cancer Father     esophageal  . Cancer Mother     liver  . Urolithiasis Neg Hx   . Prostate cancer Neg Hx   . Kidney disease Neg Hx   . Kidney cancer Neg Hx     Social History   Social History  . Marital Status: Single    Spouse Name: N/A  . Number of Children: N/A  . Years of Education: N/A   Social History Main Topics  . Smoking status: Never Smoker   . Smokeless tobacco: None  . Alcohol Use: 0.6 oz/week    1 Standard drinks or equivalent per week  . Drug Use: No  . Sexual Activity: Not Asked   Other  Topics Concern  . None   Social History Narrative     Review of Systems: A 12 point ROS discussed and pertinent positives are indicated in the HPI above.  All other systems are negative.  Review of Systems  Constitutional: Negative.   Respiratory: Negative.   Cardiovascular: Negative.   Gastrointestinal: Negative.   Genitourinary: Positive for hematuria and flank pain.    Vital Signs: BP 136/96 mmHg  Pulse 54  Temp(Src) 98.1 F (36.7 C) (Oral)  Resp 20  Ht  (1.778 m)  Wt 228 lb (103.42 kg)  BMI 32.71 kg/m2  SpO2 96%  Physical Exam  Constitutional: He appears well-developed and well-nourished.  Cardiovascular: Normal rate, regular rhythm and normal heart sounds.  Exam reveals no gallop and no friction rub.   No murmur heard. Pulmonary/Chest: Effort normal. No respiratory distress. He has no wheezes. He has no rales. He exhibits no tenderness.  Abdominal: Soft. Bowel sounds are normal. He exhibits no distension. There is no tenderness.     MD Evaluation Airway: WNL Heart: WNL Abdomen: WNL Chest/ Lungs: WNL ASA  Classification: 1 Mallampati/Airway Score: Two  Imaging: No results found.  Labs:  CBC:  Recent Labs  08/31/15 1058 10/24/15 0636  WBC 10.5 8.6  HGB  --  14.4  HCT 43.2 42.4  PLT  --  215    COAGS:  Recent Labs  10/24/15 0636  INR 0.95  APTT 28    BMP:  Recent Labs  08/31/15 1045 10/11/15 1315 10/24/15 0636  NA 140 139 138  K 4.7 4.0 4.1  CL 98 104 106  CO2 24 26 27   GLUCOSE 222* 229* 177*  BUN 15 15 18   CALCIUM 9.8 9.2 9.1  CREATININE 0.97 1.08 0.94  GFRNONAA 85 >60 >60  GFRAA 98 >60 >60    LIVER FUNCTION TESTS: No results for input(s): BILITOT, AST, ALT, ALKPHOS, PROT, ALBUMIN in the last 8760 hours.  TUMOR MARKERS: No results for input(s): AFPTM, CEA, CA199, CHROMGRNA in the last 8760 hours.  Assessment and Plan:  59 yo with bilateral kidney stones and large stone in left renal pelvis.  Scheduled for  percutaneous nephrolithotomy procedure today.  Discussed the left percutaneous nephroureteral catheter placement with imaging guidance.  Discussed risks which include bleeding and infection with patient.  Plan for ciprofloxacin prior to procedure.  Informed consent obtained for image-guided left nephroureteral catheter placement with moderate sedation.     SignedAbundio Miu: Tedrick Port RYAN 10/24/2015, 7:39 AM

## 2015-10-24 NOTE — OR Nursing (Signed)
Transferred to surgery via bed.

## 2015-10-24 NOTE — Procedures (Signed)
Post-Procedure Note  Pre-operative Diagnosis: Left renal pelvic stone       Post-operative Diagnosis: Same   Indications: Scheduled for left nephrolithotomy procedure.  Procedure Details:  Informed consent obtained.  Left renal stone is radiolucent and could not see with fluoroscopy.  Left renal pelvis was punctured with US guidance.  Collecting system was opacified with contrast and placed a nephroureteral catheter in a lower/mid pole calyx.  Catheter tip in bladder.  Consent: Informed consent was obtained. Risks of the procedure were discussed including: infection, bleeding, pain and headache.  Findings: Radiolucent renal pelvic stone.  Access obtained from lower/mid calyx.    Complications: None     Condition: Stable  Plan: Scheduled for nephrolithotomy in OR today.

## 2015-10-24 NOTE — Progress Notes (Signed)
Pharmacy note  Pharmacy consulted to dose vancomycin and gentamicin for surgical prophylaxis in this 59 year old male undergoing urological procedure.  Total body weight: 103kg Ideal body weight: 73kg Adjusted body weight: 85kg  Estimated Creatinine Clearance: 102 mL/min (by C-G formula based on Cr of 0.94).   Orders placed for:  Vancomycin 1500mg  IV x 1 (15mg /kg) prior to procedure  Gentamicin 425mg  IV x 1 (5mg /kg, AdjBW) prior to procedure  Garlon HatchetJody Brek Reece, PharmD Clinical Pharmacist  10/24/2015 9:56 AM

## 2015-10-24 NOTE — Anesthesia Preprocedure Evaluation (Signed)
Anesthesia Evaluation  Patient identified by MRN, date of birth, ID band Patient awake    Reviewed: Allergy & Precautions, NPO status , Patient's Chart, lab work & pertinent test results  Airway Mallampati: II  TM Distance: >3 FB Neck ROM: Full    Dental  (+) Teeth Intact   Pulmonary    Pulmonary exam normal        Cardiovascular Exercise Tolerance: Good hypertension, Pt. on medications and Pt. on home beta blockers Normal cardiovascular exam     Neuro/Psych    GI/Hepatic   Endo/Other  diabetes, Type 2BG 177.  Renal/GU Renal diseaseStones.     Musculoskeletal   Abdominal (+) + obese,  Abdomen: soft.    Peds  Hematology   Anesthesia Other Findings   Reproductive/Obstetrics                             Anesthesia Physical Anesthesia Plan  ASA: III  Anesthesia Plan: General   Post-op Pain Management:    Induction: Intravenous  Airway Management Planned: Oral ETT  Additional Equipment:   Intra-op Plan:   Post-operative Plan: Extubation in OR  Informed Consent: I have reviewed the patients History and Physical, chart, labs and discussed the procedure including the risks, benefits and alternatives for the proposed anesthesia with the patient or authorized representative who has indicated his/her understanding and acceptance.     Plan Discussed with: CRNA  Anesthesia Plan Comments: (Prone. Prior lung surgery, DM, BMI 32.8.)        Anesthesia Quick Evaluation

## 2015-10-24 NOTE — Anesthesia Postprocedure Evaluation (Signed)
  Anesthesia Post-op Note  Patient: Elease HashimotoKeith W Burmaster  Procedure(s) Performed: Procedure(s): NEPHROLITHOTOMY PERCUTANEOUS (Left) HOLMIUM LASER APPLICATION (Left)  Anesthesia type:General  Patient location: PACU  Post pain: Pain level controlled  Post assessment: Post-op Vital signs reviewed, Patient's Cardiovascular Status Stable, Respiratory Function Stable, Patent Airway and No signs of Nausea or vomiting  Post vital signs: Reviewed and stable  Last Vitals:  Filed Vitals:   10/24/15 1311  BP: 151/89  Pulse: 67  Temp:   Resp: 13    Level of consciousness: awake, alert  and patient cooperative  Complications: No apparent anesthesia complications

## 2015-10-24 NOTE — OR Nursing (Deleted)
Per Dr. Dimple Caseyice, hold bisoprolol prior to procedure due to HR of 43.

## 2015-10-24 NOTE — Discharge Instructions (Signed)
Ureteral Stent Implantation, Care After Refer to this sheet in the next few weeks. These instructions provide you with information on caring for yourself after your procedure. Your health care provider may also give you more specific instructions. Your treatment has been planned according to current medical practices, but problems sometimes occur. Call your health care provider if you have any problems or questions after your procedure. WHAT TO EXPECT AFTER THE PROCEDURE You should be back to normal activity within 48 hours after the procedure. Nausea and vomiting may occur and are commonly the result of anesthesia. It is common to experience sharp pain in the back or lower abdomen and penis with voiding. This is caused by movement of the ends of the stent with the act of urinating.It usually goes away within minutes after you have stopped urinating. HOME CARE INSTRUCTIONS Make sure to drink plenty of fluids. You may have small amounts of bleeding, causing your urine to be red. This is normal. Certain movements may trigger pain or a feeling that you need to urinate. You may be given medicines to prevent infection or bladder spasms. Be sure to take all medicines as directed. Only take over-the-counter or prescription medicines for pain, discomfort, or fever as directed by your health care provider. Do not take aspirin, as this can make bleeding worse.  Your stent will be left in for 2 weeks. The stent will be removed by your health care provider in the office. Be sure to keep all follow-up appointments so your health care provider can check that you are healing properly.  SEEK MEDICAL CARE IF:  You experience increasing pain.  Your pain medicine is not working. SEEK IMMEDIATE MEDICAL CARE IF:  Your urine is dark red or has blood clots.  You are leaking urine (incontinent).  You have a fever, chills, feeling sick to your stomach (nausea), or vomiting.  Your pain is not relieved by pain  medicine.  The end of the stent comes out of the urethra.  You are unable to urinate.   This information is not intended to replace advice given to you by your health care provider. Make sure you discuss any questions you have with your health care provider.   Document Released: 08/05/2013 Document Revised: 12/08/2013 Document Reviewed: 06/17/2015 Elsevier Interactive Patient Education [employer]2016 [employer] Inc[employer]

## 2015-10-24 NOTE — H&P (Signed)
H&P  Chief Complaint: left renal pelvic stone.   History of Present Illness: 59 yo with left flank pain. Sept 2016 CT revealed: CT abd/pelvis 1.8 x 0.9 x 2.7 cm calculus in the left renal pelvis. There is some mild fullness of the left renal pelvis but no frank hydronephrosis. There is also some inflammatory changes in the peripelvic fat. 2. 2 mm nonobstructive calculus in the upper pole collecting system of the right kidney. 3. No ureteral stones or findings of urinary tract obstruction at this time.  He was counseled on various treatment options and elected to proceed with PCNL, left. He has been well with no fever or dysuria. CBC, chemistry, PT/PTT and urine Cx all normal.   I reviewed the IR images with Dr. Lowella DandyHenn. He got good LP posterior access. The stone was visible on scout.   Pt is well but having some left flank pain and urgency to void.    Past Medical History  Diagnosis Date  . Arthritis   . Diabetes mellitus without complication (HCC)   . Hypertension   . Kidney stone    Past Surgical History  Procedure Laterality Date  . 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 Lithotripsy    . Inguinal hernia repair Right 1990  . Eye surgery      Home Medications:  Prescriptions prior to admission  Medication Sig Dispense Refill Last Dose  . bisoprolol-hydrochlorothiazide (ZIAC) 5-6.25 MG per tablet Take 1 tablet by mouth daily.  4 10/24/2015 at 0600  . Hydrocodone-Acetaminophen (VICODIN) 5-300 MG TABS 1-2 tabs every 4-6 hours for pain (Patient not taking: Reported on 10/24/2015) 25 each 0 Not Taking at Unknown time  . KOMBIGLYZE XR 04-999 MG TB24 Take 1 tablet by mouth every morning.   2 10/21/2015   Allergies:  Allergies  Allergen Reactions  . Penicillins Hives    Family History  Problem Relation Age of Onset  . Cancer Father     esophageal  . Cancer Mother     liver  . Urolithiasis Neg Hx   . Prostate cancer Neg Hx   . Kidney disease Neg Hx   .  Kidney cancer Neg Hx    Social History:  reports that he has never smoked. He does not have any smokeless tobacco history on file. He reports that he drinks about 0.6 oz of alcohol per week. He reports that he does not use illicit drugs.  ROS: A complete review of systems was performed.  All systems are negative except for pertinent findings as noted. ROS   Physical Exam:  Vital signs in last 24 hours: Temp:  [98.1 F (36.7 C)] 98.1 F (36.7 C) (11/07 0642) Pulse Rate:  [41-55] 41 (11/07 0900) Resp:  [10-20] 15 (11/07 0834) BP: (125-179)/(73-96) 125/90 mmHg (11/07 0900) SpO2:  [95 %-100 %] 95 % (11/07 0900) Weight:  [103.42 kg (228 lb)] 103.42 kg (228 lb) (11/07 44010642) General:  Alert and oriented, No acute distress HEENT: Normocephalic, atraumatic Cardiovascular: Regular rate and rhythm Lungs: Regular rate and effort Abdomen: Soft, nontender, nondistended, no abdominal masses Back: left Nx access in place  Extremities: No edema Neurologic: Grossly intact  Laboratory Data:  Results for orders placed or performed during the hospital encounter of 10/24/15 (from the past 24 hour(s))  APTT     Status: None   Collection Time: 10/24/15  6:36 AM  Result Value Ref Range   aPTT 28 24 - 36 seconds  Basic metabolic  panel     Status: Abnormal   Collection Time: 10/24/15  6:36 AM  Result Value Ref Range   Sodium 138 135 - 145 mmol/L   Potassium 4.1 3.5 - 5.1 mmol/L   Chloride 106 101 - 111 mmol/L   CO2 27 22 - 32 mmol/L   Glucose, Bld 177 (H) 65 - 99 mg/dL   BUN 18 6 - 20 mg/dL   Creatinine, Ser 6.29 0.61 - 1.24 mg/dL   Calcium 9.1 8.9 - 52.8 mg/dL   GFR calc non Af Amer >60 >60 mL/min   GFR calc Af Amer >60 >60 mL/min   Anion gap 5 5 - 15  CBC     Status: None   Collection Time: 10/24/15  6:36 AM  Result Value Ref Range   WBC 8.6 3.8 - 10.6 K/uL   RBC 5.18 4.40 - 5.90 MIL/uL   Hemoglobin 14.4 13.0 - 18.0 g/dL   HCT 41.3 24.4 - 01.0 %   MCV 81.9 80.0 - 100.0 fL   MCH 27.8  26.0 - 34.0 pg   MCHC 34.0 32.0 - 36.0 g/dL   RDW 27.2 53.6 - 64.4 %   Platelets 215 150 - 440 K/uL  Protime-INR     Status: None   Collection Time: 10/24/15  6:36 AM  Result Value Ref Range   Prothrombin Time 12.9 11.4 - 15.0 seconds   INR 0.95   Type and screen [facility]     Status: None   Collection Time: 10/24/15  6:36 AM  Result Value Ref Range   ABO/RH(D) O POS    Antibody Screen NEG    Sample Expiration 10/27/2015    No results found for this or any previous visit (from the past 240 hour(s)). Creatinine:  Recent Labs  10/24/15 0636  CREATININE 0.94    Impression/Assessment:  Large left renal pelvic stone   Plan:  I discussed with the patient the nature, potential benefits, risks and alternatives to Left PCNL, including side effects of the proposed treatment, the likelihood of the patient achieving the goals of the procedure, and any potential problems that might occur during the procedure or recuperation. Discussed risks of bleeding, infection, damage to kidney, other surrounding structures among others. Discussed rare need for blood transfusion or embolization. Discussed post-op tubes and stents. Discussed possible need for second look/staged procedure. All questions answered. Patient elects to proceed.   Endora Teresi 10/24/2015, 9:37 AM

## 2015-10-24 NOTE — Anesthesia Procedure Notes (Signed)
Procedure Name: Intubation Date/Time: 10/24/2015 10:39 AM Performed by: Michaele OfferSAVAGE, Zakyah Yanes Pre-anesthesia Checklist: Patient identified, Emergency Drugs available, Suction available, Patient being monitored and Timeout performed Patient Re-evaluated:Patient Re-evaluated prior to inductionOxygen Delivery Method: Circle system utilized Preoxygenation: Pre-oxygenation with 100% oxygen Intubation Type: IV induction Ventilation: Mask ventilation without difficulty Laryngoscope Size: Mac and 4 Grade View: Grade II Tube type: Oral Tube size: 7.5 mm Number of attempts: 1 Airway Equipment and Method: Rigid stylet Placement Confirmation: ETT inserted through vocal cords under direct vision,  positive ETCO2 and breath sounds checked- equal and bilateral Secured at: 22 cm Tube secured with: Tape Dental Injury: Teeth and Oropharynx as per pre-operative assessment

## 2015-10-24 NOTE — Telephone Encounter (Signed)
-----   Message from Jerilee FieldMatthew Eskridge, MD sent at 10/24/2015 12:52 PM EST ----- Mr. Billy Scott needs a two week follow-up for Cystoscopy, stent removal in office.  He is s/p left PCNL and ureteral stent placement.

## 2015-10-25 DIAGNOSIS — N200 Calculus of kidney: Secondary | ICD-10-CM | POA: Diagnosis not present

## 2015-10-25 LAB — GLUCOSE, CAPILLARY
GLUCOSE-CAPILLARY: 153 mg/dL — AB (ref 65–99)
Glucose-Capillary: 135 mg/dL — ABNORMAL HIGH (ref 65–99)

## 2015-10-25 MED ORDER — SULFAMETHOXAZOLE-TRIMETHOPRIM 800-160 MG PO TABS: 1.0000 | ORAL_TABLET | Freq: Two times a day (BID) | ORAL | Status: DC

## 2015-10-25 NOTE — Telephone Encounter (Signed)
Pt notified of f/u appt on 11/08/15 @10 :30. Pt voices understanding.

## 2015-10-25 NOTE — Progress Notes (Signed)
Discharge  Pt was able to participate with discharge instructions. Pt was instructed to call MD if he has not voided by [phone]. PIV and Foley removed before discharge and pt allowed to dress. Pt declined to use a wheelchair for discharge and requested to walk out. He was given a copy of his paperwork. Pt has no complaints of pain at time of discharge.

## 2015-10-28 LAB — STONE ANALYSIS
Stone Weight KSTONE: 171.7 mg
URIC ACID: 100 %

## 2015-11-08 ENCOUNTER — Encounter: Payer: Self-pay | Admitting: Urology

## 2015-11-08 ENCOUNTER — Ambulatory Visit (INDEPENDENT_AMBULATORY_CARE_PROVIDER_SITE_OTHER): Payer: BLUE CROSS/BLUE SHIELD | Admitting: Urology

## 2015-11-08 VITALS — BP 137/86 | HR 73 | Ht 70.0 in | Wt 227.8 lb

## 2015-11-08 DIAGNOSIS — Z125 Encounter for screening for malignant neoplasm of prostate: Secondary | ICD-10-CM | POA: Insufficient documentation

## 2015-11-08 DIAGNOSIS — N200 Calculus of kidney: Secondary | ICD-10-CM

## 2015-11-08 LAB — URINALYSIS, COMPLETE
BILIRUBIN UA: NEGATIVE
GLUCOSE, UA: NEGATIVE
KETONES UA: NEGATIVE
Nitrite, UA: NEGATIVE
SPEC GRAV UA: 1.025 (ref 1.005–1.030)
Urobilinogen, Ur: 0.2 mg/dL (ref 0.2–1.0)
pH, UA: 5 (ref 5.0–7.5)

## 2015-11-08 LAB — MICROSCOPIC EXAMINATION
RBC, UA: >30 /hpf — ABNORMAL HIGH (ref 0–?)
WBC, UA: >30 /hpf — ABNORMAL HIGH (ref 0–?)

## 2015-11-08 MED ORDER — LIDOCAINE HCL 2 % EX GEL
1.0000 | Freq: Once | CUTANEOUS | Status: AC
Start: 2015-11-08 — End: 2015-11-08
  Administered 2015-11-08: 1 via URETHRAL

## 2015-11-08 MED ORDER — CIPROFLOXACIN HCL 500 MG PO TABS
500.0000 mg | ORAL_TABLET | Freq: Once | ORAL | Status: AC
  Administered 2015-11-08: 500 mg via ORAL

## 2015-11-08 NOTE — Progress Notes (Signed)
11/08/2015 10:32 AM   Elease HashimotoKeith W Goosby [birth year] [id number]  Referring provider: Jabier MuttonJames Strickland, MD [facility] [address] [place], [place] [address]  Chief Complaint  Patient presents with  . Routine Post Op    cysto, stent removal     HPI:  1 - Recurrent Nephrolithiasis Pre 2016 - SWL x several 10/2015 - Lt PCNL by Eskridge (suspect possible uric acid stones)  2 - Metabolic Stone Disease -  Eval 2016: BMP, PTH, Urate - pending; Composition - pending; 24 Hr urines - pending  3 - Prostate Screening - No FHX prostate cancer, Gets done though DOT physical yearly and normal per report.    Today "Billy Scott" is seen for cysto stent pull after recent stent pull.    PMH: Past Medical History  Diagnosis Date  . Arthritis   . Diabetes mellitus without complication (HCC)   . Hypertension   . Kidney stone     Surgical History: Past Surgical History  Procedure Laterality Date  . 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 Lithotripsy    . Inguinal hernia repair Right 1990  . Eye surgery    . Nephrolithotomy Left 10/24/2015    Procedure: NEPHROLITHOTOMY PERCUTANEOUS;  Surgeon: Jerilee FieldMatthew Eskridge, MD;  Location: [facility] ORS;  Service: Urology;  Laterality: Left;  . Holmium laser application Left 10/24/2015    Procedure: HOLMIUM LASER APPLICATION;  Surgeon: Jerilee FieldMatthew Eskridge, MD;  Location: [facility] ORS;  Service: Urology;  Laterality: Left;    Home Medications:    Medication List       This list is accurate as of: 11/08/15 10:32 AM.  Always use your most recent med list.               bisoprolol-hydrochlorothiazide 5-6.25 MG tablet  Commonly known as:  ZIAC  Take 1 tablet by mouth daily.     KOMBIGLYZE XR 04-999 MG Tb24  Generic drug:  Saxagliptin-Metformin  Take 1 tablet by mouth daily.     sulfamethoxazole-trimethoprim 800-160 MG tablet  Commonly known as:  BACTRIM DS,SEPTRA DS  Take 1 tablet by mouth every 12 (twelve) hours.        Allergies:  Allergies  Allergen Reactions  . Penicillins Hives and Other (See Comments)    Has patient had a PCN reaction causing immediate rash, facial/tongue/throat swelling, SOB or lightheadedness with hypotension: No Has patient had a PCN reaction causing severe rash involving mucus membranes or skin necrosis: No Has patient had a PCN reaction that required hospitalization No Has patient had a PCN reaction occurring within the last 10 years: No If all of the above answers are "NO", then may proceed with Cephalosporin use.    Family History: Family History  Problem Relation Age of Onset  . Cancer Father     esophageal  . Cancer Mother     liver  . Urolithiasis Neg Hx   . Prostate cancer Neg Hx   . Kidney disease Neg Hx   . Kidney cancer Neg Hx     Social History:  reports that he has never smoked. He does not have any smokeless tobacco history on file. He reports that he drinks about 0.6 oz of alcohol per week. He reports that he does not use illicit drugs.  ROS:   Review of Systems  Gastrointestinal (upper)  : Negative for upper GI symptoms  Gastrointestinal (lower) : Negative for lower GI symptoms  Constitutional : Negative for symptoms  Skin:  Negative for skin symptoms  Eyes: Negative for eye symptoms  Ear/Nose/Throat : Negative for Ear/Nose/Throat symptoms  Hematologic/Lymphatic: Negative for Hematologic/Lymphatic symptoms  Cardiovascular : Negative for cardiovascular symptoms  Respiratory : Negative for respiratory symptoms  Endocrine: Negative for endocrine symptoms  Musculoskeletal: Negative for musculoskeletal symptoms  Neurological: Negative for neurological symptoms  Psychologic: Negative for psychiatric symptoms     Physical Exam: BP 137/86 mmHg  Pulse 73  Ht  (1.778 m)  Wt 227 lb 12.8 oz (103.329 kg)  BMI 32.69 kg/m2  Constitutional:  Alert and oriented, No acute distress. HEENT: [place] AT, moist mucus membranes.   Trachea midline, no masses. Cardiovascular: No clubbing, cyanosis, or edema. Respiratory: Normal respiratory effort, no increased work of breathing. GI: Abdomen is soft, nontender, nondistended, no abdominal masses GU: No CVA tenderness. Phallus straight and circumcised.  Skin: No rashes, bruises or suspicious lesions. Lymph: No cervical or inguinal adenopathy. Neurologic: Grossly intact, no focal deficits, moving all 4 extremities. Psychiatric: Normal mood and affect.  Laboratory Data: Lab Results  Component Value Date   WBC 8.6 10/24/2015   HGB 14.4 10/24/2015   HCT 42.4 10/24/2015   MCV 81.9 10/24/2015   PLT 215 10/24/2015    Lab Results  Component Value Date   CREATININE 0.94 10/24/2015    No results found for: PSA  No results found for: TESTOSTERONE  No results found for: HGBA1C  Urinalysis    Component Value Date/Time   COLORURINE YELLOW* 10/11/2015 1315   APPEARANCEUR CLEAR* 10/11/2015 1315   LABSPEC 1.019 10/11/2015 1315   PHURINE 5.0 10/11/2015 1315   GLUCOSEU 150* 10/11/2015 1315   HGBUR 3+* 10/11/2015 1315   BILIRUBINUR NEGATIVE 10/11/2015 1315   BILIRUBINUR Negative 10/11/2015 1049   KETONESUR NEGATIVE 10/11/2015 1315   PROTEINUR NEGATIVE 10/11/2015 1315   NITRITE NEGATIVE 10/11/2015 1315   NITRITE Negative 10/11/2015 1049   LEUKOCYTESUR NEGATIVE 10/11/2015 1315   LEUKOCYTESUR Negative 10/11/2015 1049    Cystoscopy/ Stent removal procedure  Patient identification was confirmed, informed consent was obtained, and patient was prepped using Betadine solution.  Lidocaine jelly was administered per urethral meatus.    Preoperative abx where received prior to procedure.    Procedure: - Flexible cystoscope introduced, without any difficulty.   - Thorough search of the bladder revealed:    normal urethral meatus  Stent seen emanating from LEFT ureteral orifice, grasped with stent graspers, and removed in entirety.     Post-Procedure: - Patient  tolerated the procedure well. Kellita assist.     Assessment & Plan:   1 - Recurrent Nephrolithiasis - now stone free, rec at least annual surveillane given recurrence pattern. Stent out today as per above.   2 - Metabolic Stone Disease - rec full eval given recurrence pattern.  3 - Prostate Screening - up to date this year, encouraged to contiue annual screening give overall great health.   4 - RTC 6 weeks with BMP, PTH, Urate,  and 24 hr urines to discuss metabolic management.    No Follow-up on file.   Sebastian Ache, MD  [facility] [address] [place], [place] [address] [phone]

## 2015-11-09 LAB — BASIC METABOLIC PANEL
BUN / CREAT RATIO: 21 — AB (ref 9–20)
BUN: 21 mg/dL (ref 6–24)
CO2: 25 mmol/L (ref 18–29)
CREATININE: 0.98 mg/dL (ref 0.76–1.27)
Calcium: 9.8 mg/dL (ref 8.7–10.2)
Chloride: 100 mmol/L (ref 97–106)
GFR calc non Af Amer: 84 mL/min/{1.73_m2} (ref >59)
GFR, EST AFRICAN AMERICAN: 97 mL/min/{1.73_m2} (ref >59)
GLUCOSE: 179 mg/dL — AB (ref 65–99)
Potassium: 4.7 mmol/L (ref 3.5–5.2)
SODIUM: 141 mmol/L (ref 136–144)

## 2015-11-09 LAB — URIC ACID: Uric Acid: 7.5 mg/dL (ref 3.7–8.6)

## 2015-11-09 LAB — PTH, INTACT AND CALCIUM

## 2015-11-25 IMAGING — XA IR NEPHROSTOMY PLACEMENT LEFT
9 series · 10 of 10 positions shown · non-contrast
Comparison: none

CLINICAL DATA: 58-year-old with bilateral flank pain and large
stone in the left renal pelvis. Patient scheduled for percutaneous
nephrolithotomy procedure.

[Series 1: single · 1 of 1 slices shown (1 of 7)]
[im 1/1]
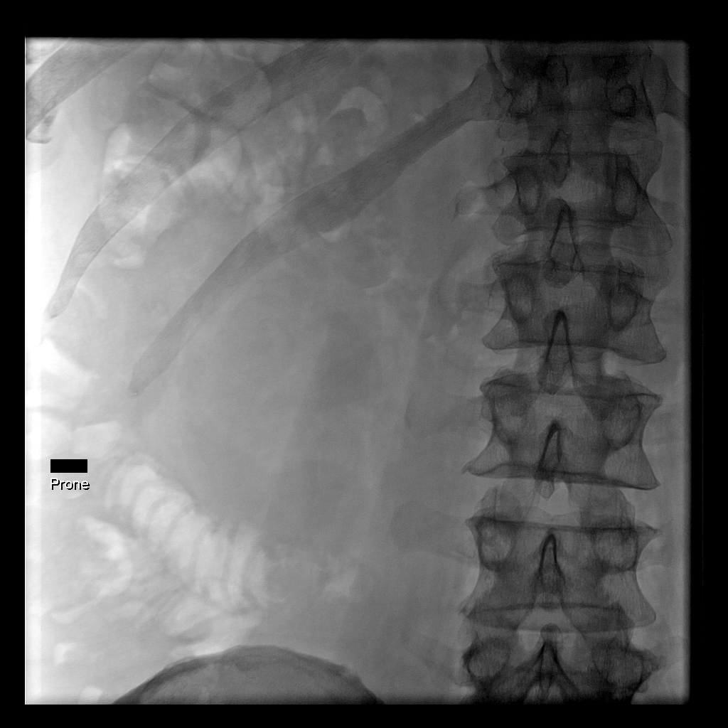

[Series 2: single · 1 of 1 slices shown (2 of 7)]
[im 1/1]
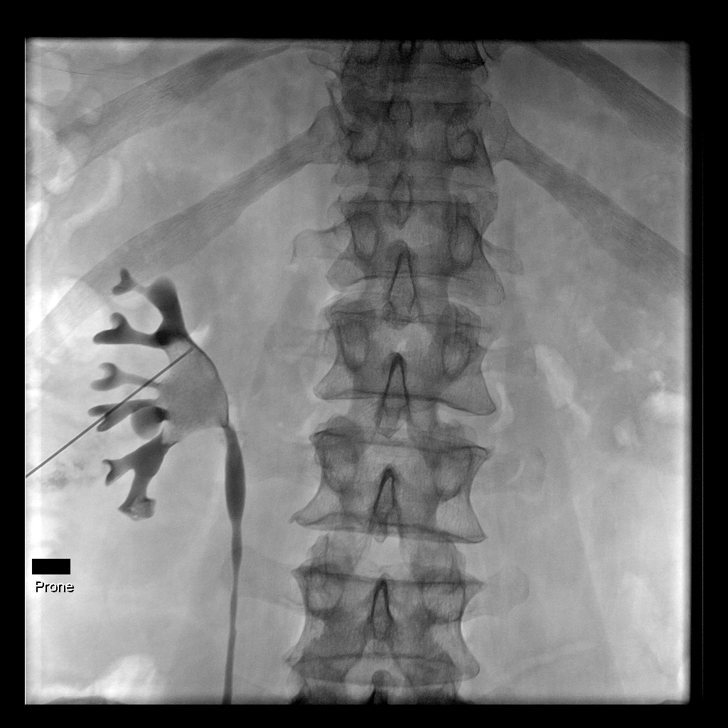

[Series 3: single · 1 of 1 slices shown (3 of 7)]
[im 1/1]
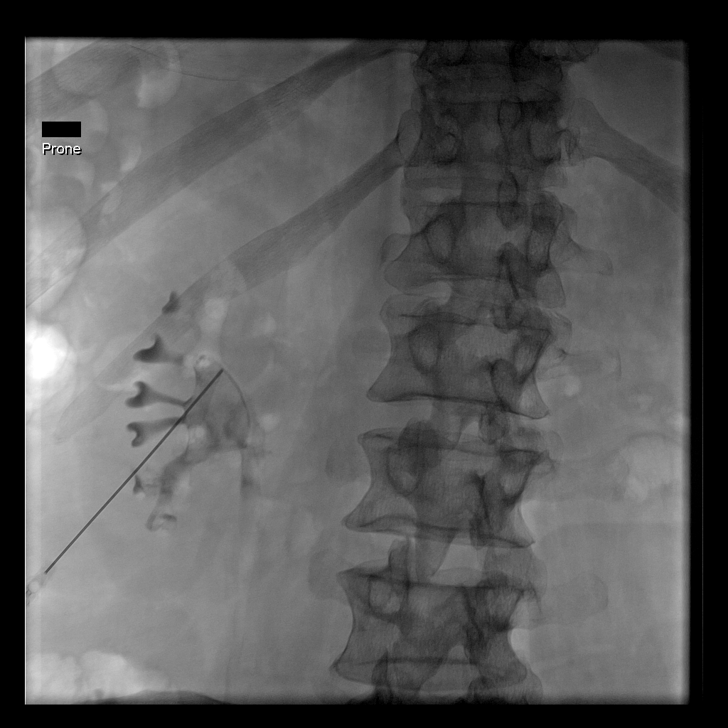

[Series 4: fl - angio · 1 of 1 slices shown (1 of 2)]
[im 1/1]
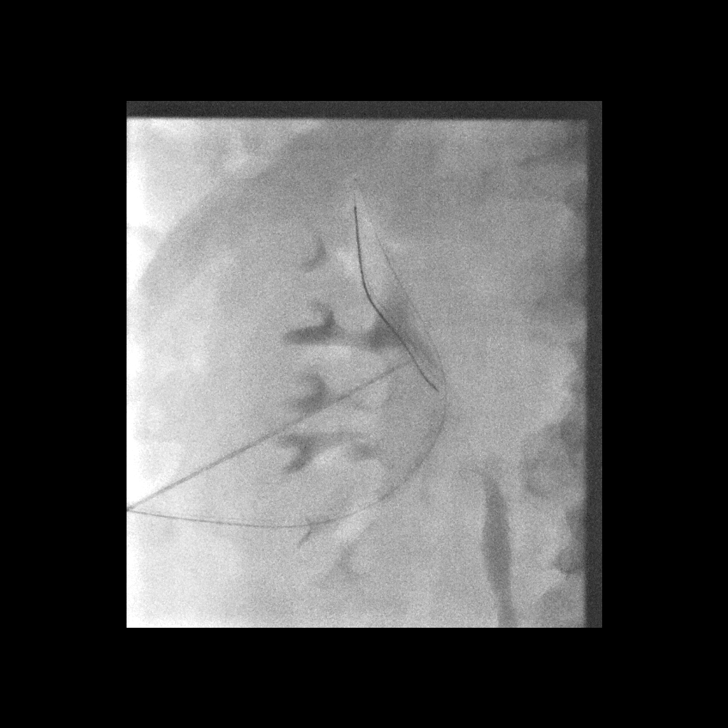

[Series 5: fl - angio · 1 of 1 slices shown (2 of 2)]
[im 1/1]
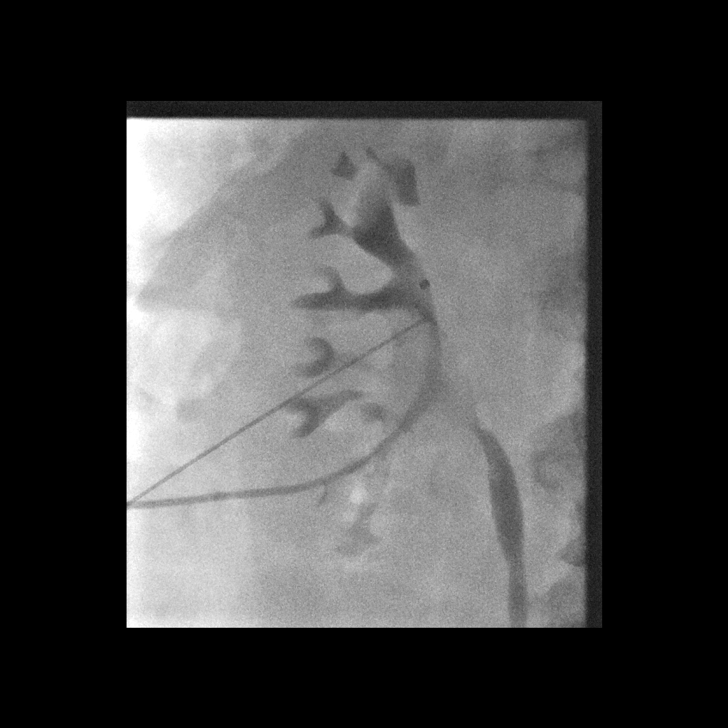

[Series 6: single · 1 of 1 slices shown (4 of 7)]
[im 1/1]
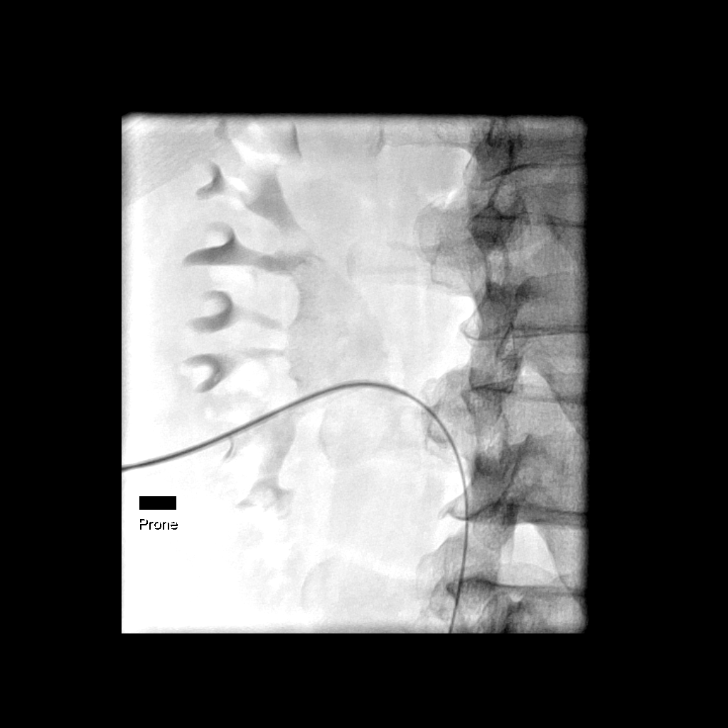

[Series 7: single · 2 of 2 slices shown (5 of 7)]
[im 1/2]
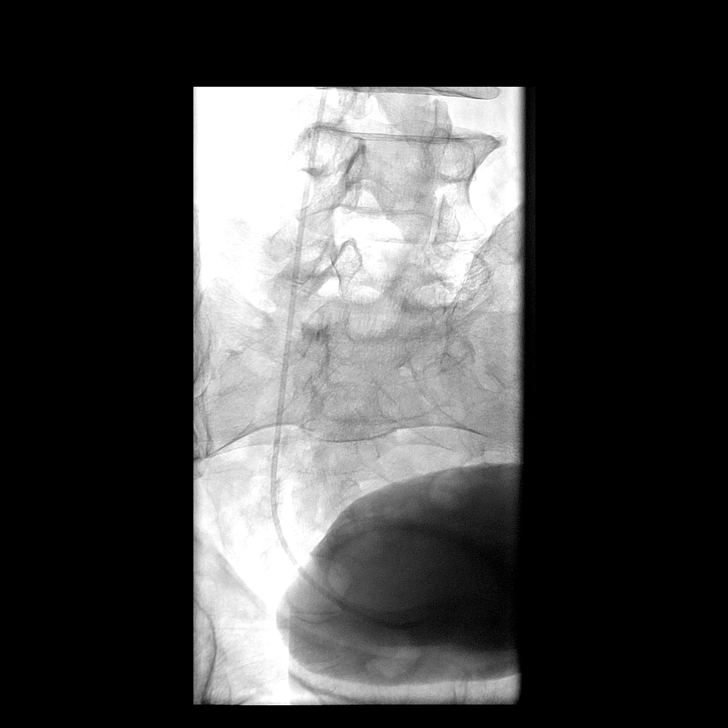
[im 2/2]
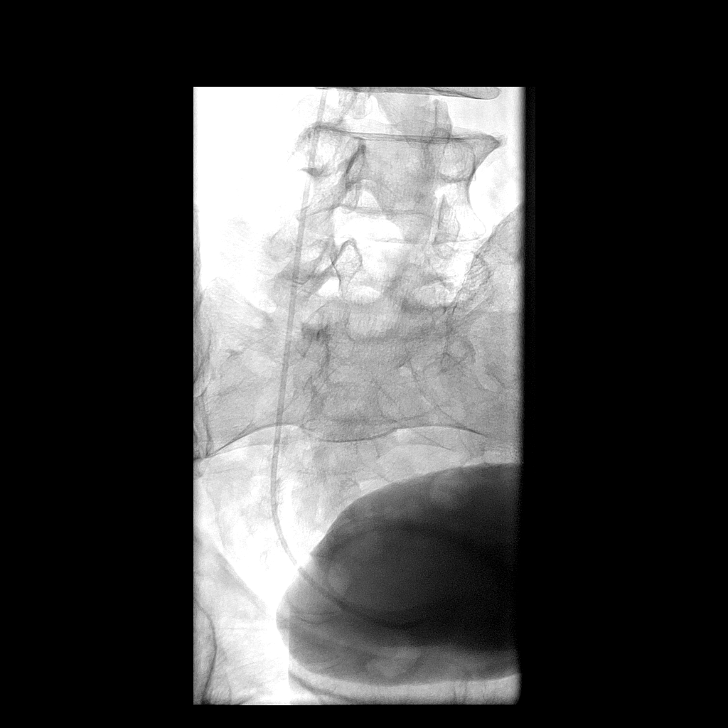

[Series 8: single · 1 of 1 slices shown (6 of 7)]
[im 1/1]
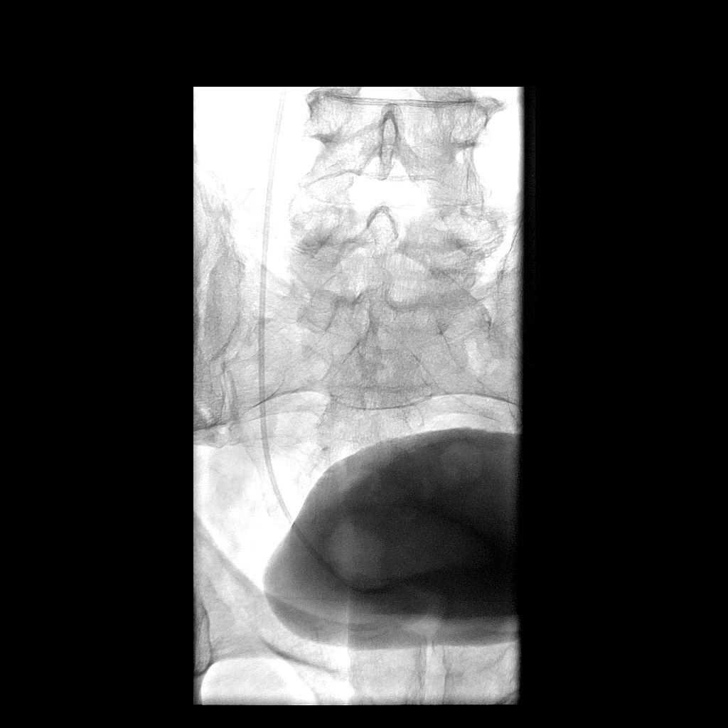

[Series 9: single · 1 of 1 slices shown (7 of 7)]
[im 1/1]
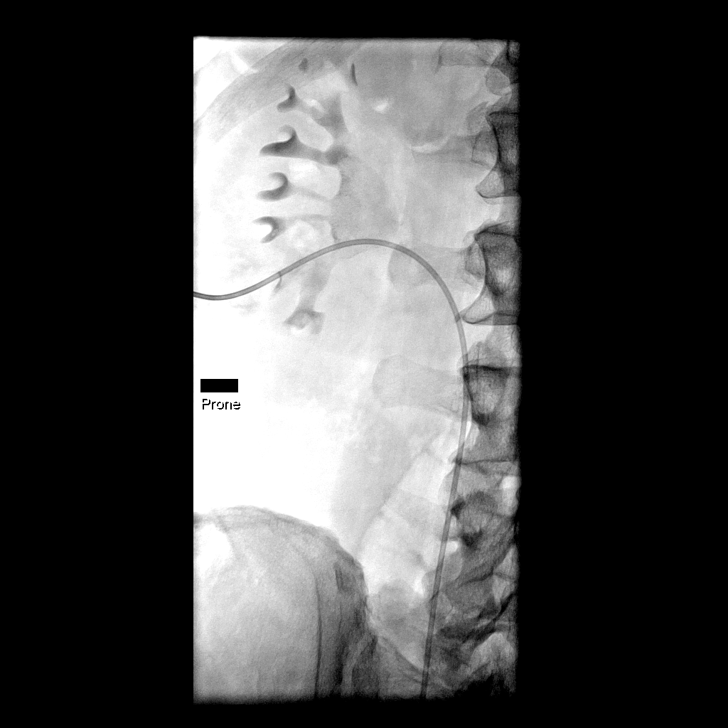

[10 of 10 positions shown; findings below may reference images not displayed]

EXAM:
PLACEMENT OF LEFT NEPHROURETERAL CATHETER WITH ULTRASOUND AND
FLUOROSCOPIC GUIDANCE

FLUOROSCOPY TIME:  11 minutes and 30 seconds

MEDICATIONS:
Ciprofloxacin 400 mg. 3 mg Versed, 125 mcg fentanyl. A radiology
nurse monitored the patient for moderate sedation.

ANESTHESIA/SEDATION:
Moderate sedation time: 45 minutes

PROCEDURE:
The procedure was explained to the patient. The risks and benefits
of the procedure were discussed and the patient's questions were
addressed. Informed consent was obtained from the patient. The left
flank was prepped and draped in a sterile fashion. Maximal barrier
sterile technique was utilized including caps, mask, sterile gowns,
sterile gloves, sterile drape, hand hygiene and skin antiseptic.

The left renal stone was not well visualized with fluoroscopy. As a
result, ultrasound was prepped in a sterile fashion and used to
identify the left renal stone. Skin was anesthetized with 1%
lidocaine. A 22 gauge Chiba needle was directed onto the left renal
pelvic stone with ultrasound guidance. Contrast was used to opacify
the renal collecting system. Air was also injected to identify the
more posterior calices. The mid/lower pole calyx was targeted. A 21
gauge needle was directed into this calyx with fluoroscopic
guidance. A wire was advanced into the collecting system. An
Accustick dilator set was placed. A J wire was placed and a 5 French
catheter was placed over the wire. Catheter was directed into the
ureter using a Glidewire. Catheter tip placed in the urinary
bladder. Catheter was sutured to skin and a bandage was placed.
FINDINGS: Large radiolucent stone occupying most of the left renal pelvis. No
significant hydronephrosis. Access obtained from a mid/lower pole
calyx. Catheter tip placed in the urinary bladder.

Estimated blood loss:  Minimal

COMPLICATIONS:
None
IMPRESSION: Successful placement of a left nephroureteral catheter with
ultrasound and fluoroscopic guidance.

## 2015-11-28 NOTE — Discharge Summary (Signed)
Discharge Summary  Patient is a 59 year old male who was admitted for observation after a left PCNL for a left renal pelvic stone (1.8 cm x 0.9 cm x 2.7 cm) seen on CT completed in 08/2015.  There was mild fullness of the left renal pelvis, but no frank hydronephrosis.  There was also some inflammatory changes in the peri pelvic fat.  Patient underwent nephroureteral access the morning of 10/24/2015 with IR.  He then underwent left PCNL that afternoon with Dr. Mena GoesEskridge.  He was admitted overnight for observation     Anti-infectives: Anti-infectives    Start     Dose/Rate Route Frequency Ordered Stop   10/25/15 0000  sulfamethoxazole-trimethoprim (BACTRIM DS,SEPTRA DS) 800-160 MG tablet     1 tablet Oral Every 12 hours 10/25/15 1219     10/24/15 1900  sulfamethoxazole-trimethoprim (BACTRIM DS,SEPTRA DS) 800-160 MG per tablet 1 tablet  Status:  Discontinued     1 tablet Oral Every 12 hours 10/24/15 1348 10/25/15 1636   10/24/15 1011  vancomycin (VANCOCIN) 1 GM/200ML IVPB  Status:  Discontinued    Comments:  Slemenda, Debbie: cabinet override      10/24/15 1011 10/24/15 1014   10/24/15 1000  vancomycin (VANCOCIN) 1,500 mg in sodium chloride 0.9 % 500 mL IVPB     1,500 mg 250 mL/hr over 120 Minutes Intravenous  Once 10/24/15 0957 10/24/15 1304   10/24/15 1000  gentamicin (GARAMYCIN) 430 mg in dextrose 5 % 50 mL IVPB     425 mg 121.5 mL/hr over 30 Minutes Intravenous  Once 10/24/15 0957 10/24/15 1113      No current facility-administered medications for this encounter.   Current Outpatient Prescriptions  Medication Sig Dispense Refill  . bisoprolol-hydrochlorothiazide (ZIAC) 5-6.25 MG tablet Take 1 tablet by mouth daily.    . Saxagliptin-Metformin (KOMBIGLYZE XR) 04-999 MG TB24 Take 1 tablet by mouth daily.    Marland Kitchen. sulfamethoxazole-trimethoprim (BACTRIM DS,SEPTRA DS) 800-160 MG tablet Take 1 tablet by mouth every 12 (twelve) hours. (Patient not taking: Reported on 11/08/2015) 14 tablet 0      Objective: Vital signs in last 24 hours:    Intake/Output from previous day:   Intake/Output this shift:     Physical Exam  Lab Results:  No results for input(s): WBC, HGB, HCT, PLT in the last 72 hours. BMET No results for input(s): NA, K, CL, CO2, GLUCOSE, BUN, CREATININE, CALCIUM in the last 72 hours. PT/INR No results for input(s): LABPROT, INR in the last 72 hours. ABG No results for input(s): PHART, HCO3 in the last 72 hours.  Invalid input(s): PCO2, PO2  Studies/Results: No results found.   Assessment: s/p Procedure(s): NEPHROLITHOTOMY PERCUTANEOUS HOLMIUM LASER APPLICATION  Plan: Discharge; Follow up as scheduled  CC[facility]        [facility] 11/28/2015

## 2015-12-27 ENCOUNTER — Ambulatory Visit: Payer: BLUE CROSS/BLUE SHIELD | Admitting: Urology

## 2015-12-27 ENCOUNTER — Telehealth: Payer: Self-pay | Admitting: Urology

## 2015-12-27 NOTE — Telephone Encounter (Signed)
Pt had appt scheduled for 9:30 this morning and didn't come in.  He wanted to know if he could get results from his blood test over the phone.  Please contact pt on his cell# if this is possible.

## 2015-12-28 NOTE — Telephone Encounter (Signed)
Please advise 

## 2015-12-28 NOTE — Telephone Encounter (Signed)
Spoke with pt who stated that he had labs drawn on 11/08/15. He was concerned about the uric acid levels. Pt also stated he has not done the LithoLink bc Dr. Berneice HeinrichManny told him to wait until he gets the lab results on uric acid.

## 2015-12-28 NOTE — Telephone Encounter (Signed)
LMOM

## 2015-12-28 NOTE — Telephone Encounter (Signed)
Patient didn't have his PTH performed because we do not have the right tube for this blood work.  We will have to research on how to get this test performed.  I also do not see the 24 hour urine results.  Did he do this test?  He will also need a RUS to make sure his kidney is not swollen.

## 2015-12-29 ENCOUNTER — Ambulatory Visit: Payer: BLUE CROSS/BLUE SHIELD | Admitting: Urology

## 2015-12-29 ENCOUNTER — Other Ambulatory Visit: Payer: Self-pay

## 2015-12-29 NOTE — Telephone Encounter (Signed)
His uric acid is normal.

## 2015-12-30 NOTE — Telephone Encounter (Signed)
LMOM- labs normal 

## 2016-09-23 ENCOUNTER — Other Ambulatory Visit: Payer: Self-pay | Admitting: Physician Assistant

## 2016-11-19 LAB — LIPID PANEL
Cholesterol: 188 (ref 0–200)
HDL: 38 (ref 35–70)
LDL Cholesterol: 123
Triglycerides: 137 (ref 40–160)

## 2016-11-19 LAB — HEMOGLOBIN A1C: Hemoglobin A1C: 8.3

## 2017-02-25 LAB — LIPID PANEL
Cholesterol: 202 — AB (ref 0–200)
HDL: 32 — AB (ref 35–70)
LDL Cholesterol: 109
Triglycerides: 307 — AB (ref 40–160)

## 2017-02-25 LAB — HEMOGLOBIN A1C: Hemoglobin A1C: 8

## 2017-10-29 LAB — LIPID PANEL
Cholesterol: 188 (ref 0–200)
HDL: 31 — AB (ref 35–70)
LDL Cholesterol: 126
Triglycerides: 155 (ref 40–160)

## 2017-10-29 LAB — HEMOGLOBIN A1C: Hemoglobin A1C: 7.9

## 2018-02-04 ENCOUNTER — Encounter: Payer: Self-pay | Admitting: *Deleted

## 2018-02-05 ENCOUNTER — Ambulatory Visit
Admission: RE | Admit: 2018-02-05 | Discharge: 2018-02-05 | Disposition: A | Discharge status: Home / Self Care | Payer: BLUE CROSS/BLUE SHIELD | Source: Ambulatory Visit | Attending: Internal Medicine | Admitting: Internal Medicine

## 2018-02-05 ENCOUNTER — Encounter
Admission: RE | Disposition: A | Discharge status: Home / Self Care | Payer: Self-pay | Source: Ambulatory Visit | Attending: Internal Medicine

## 2018-02-05 ENCOUNTER — Ambulatory Visit: Payer: BLUE CROSS/BLUE SHIELD | Admitting: Anesthesiology

## 2018-02-05 DIAGNOSIS — K640 First degree hemorrhoids: Secondary | ICD-10-CM | POA: Insufficient documentation

## 2018-02-05 DIAGNOSIS — Z880 Allergy status to penicillin: Secondary | ICD-10-CM | POA: Diagnosis not present

## 2018-02-05 DIAGNOSIS — E119 Type 2 diabetes mellitus without complications: Secondary | ICD-10-CM | POA: Diagnosis not present

## 2018-02-05 DIAGNOSIS — Z79899 Other long term (current) drug therapy: Secondary | ICD-10-CM | POA: Insufficient documentation

## 2018-02-05 DIAGNOSIS — I10 Essential (primary) hypertension: Secondary | ICD-10-CM | POA: Diagnosis not present

## 2018-02-05 DIAGNOSIS — Z1211 Encounter for screening for malignant neoplasm of colon: Secondary | ICD-10-CM | POA: Insufficient documentation

## 2018-02-05 DIAGNOSIS — Z87442 Personal history of urinary calculi: Secondary | ICD-10-CM | POA: Diagnosis not present

## 2018-02-05 DIAGNOSIS — Z7984 Long term (current) use of oral hypoglycemic drugs: Secondary | ICD-10-CM | POA: Insufficient documentation

## 2018-02-05 HISTORY — PX: COLONOSCOPY WITH PROPOFOL: SHX5780

## 2018-02-05 LAB — HM COLONOSCOPY

## 2018-02-05 SURGERY — COLONOSCOPY WITH PROPOFOL
Anesthesia: General

## 2018-02-05 MED ORDER — PHENYLEPHRINE HCL 10 MG/ML IJ SOLN
INTRAMUSCULAR | Status: DC | PRN
  Administered 2018-02-05: 100 ug via INTRAVENOUS

## 2018-02-05 MED ORDER — PROPOFOL 500 MG/50ML IV EMUL
INTRAVENOUS | Status: AC
Start: 2018-02-05 — End: 2018-02-05
  Filled 2018-02-05: qty 50

## 2018-02-05 MED ORDER — SODIUM CHLORIDE 0.9 % IJ SOLN
INTRAMUSCULAR | Status: AC
  Filled 2018-02-05: qty 10

## 2018-02-05 MED ORDER — PROPOFOL 10 MG/ML IV BOLUS
INTRAVENOUS | Status: DC | PRN
  Administered 2018-02-05: 100 mg via INTRAVENOUS

## 2018-02-05 MED ORDER — FENTANYL CITRATE (PF) 100 MCG/2ML IJ SOLN
INTRAMUSCULAR | Status: DC | PRN
  Administered 2018-02-05 (×2): 50 ug via INTRAVENOUS

## 2018-02-05 MED ORDER — PROPOFOL 500 MG/50ML IV EMUL
INTRAVENOUS | Status: DC | PRN
  Administered 2018-02-05: 160 ug/kg/min via INTRAVENOUS

## 2018-02-05 MED ORDER — FENTANYL CITRATE (PF) 100 MCG/2ML IJ SOLN
INTRAMUSCULAR | Status: AC
  Filled 2018-02-05: qty 2

## 2018-02-05 MED ORDER — LIDOCAINE HCL (PF) 2 % IJ SOLN
INTRAMUSCULAR | Status: AC
  Filled 2018-02-05: qty 10

## 2018-02-05 MED ORDER — LIDOCAINE 2% (20 MG/ML) 5 ML SYRINGE
INTRAMUSCULAR | Status: DC | PRN
  Administered 2018-02-05: 30 mg via INTRAVENOUS

## 2018-02-05 MED ORDER — SODIUM CHLORIDE 0.9 % IV SOLN
INTRAVENOUS | Status: DC
  Administered 2018-02-05: 08:00:00 via INTRAVENOUS
  Administered 2018-02-05: 1000 mL via INTRAVENOUS

## 2018-02-05 MED ORDER — PROPOFOL 10 MG/ML IV BOLUS
INTRAVENOUS | Status: AC
  Filled 2018-02-05: qty 20

## 2018-02-05 NOTE — Transfer of Care (Signed)
Immediate Anesthesia Transfer of Care Note  Patient: Billy Scott  Procedure(s) Performed: COLONOSCOPY WITH PROPOFOL (N/A )  Patient Location: PACU and Endoscopy Unit  Anesthesia Type:General  Level of Consciousness: sedated  Airway & Oxygen Therapy: Patient Spontanous Breathing and Patient connected to nasal cannula oxygen  Post-op Assessment: Report given to RN and Post -op Vital signs reviewed and stable  Post vital signs: Reviewed and stable  Last Vitals:  Vitals:   02/05/18 0650 02/05/18 0830  BP: 134/87   Pulse: 66 90  Resp: 16 12  Temp: (!) 35.2 C (!) 36.4 C  SpO2: 99% 95%    Last Pain:  Vitals:   02/05/18 0650  TempSrc: Tympanic         Complications: No apparent anesthesia complications

## 2018-02-05 NOTE — Op Note (Signed)
[facility] Patient Name: Billy Scott Procedure Date: 02/05/2018 7:28 AM MRN: [id number] Account #: [id number] Date of Birth: March 07, 1956 Admit Type: Outpatient Age: 62 Room: [facility] ENDO ROOM 2 Gender: Male Note Status: Finalized Procedure:            Colonoscopy Indications:          Screening for colorectal malignant neoplasm Providers:            Boykin Nearing. Toledo MD, MD Medicines:            Propofol per Anesthesia Complications:        No immediate complications. Procedure:            Pre-Anesthesia Assessment:                       - The risks and benefits of the procedure and the                        sedation options and risks were discussed with the                        patient. All questions were answered and informed                        consent was obtained.                       - Patient identification and proposed procedure were                        verified prior to the procedure by the nurse. The                        procedure was verified in the procedure room.                       - ASA Grade Assessment: II - A patient with mild                        systemic disease.                       - After reviewing the risks and benefits, the patient                        was deemed in satisfactory condition to undergo the                        procedure.                       After obtaining informed consent, the colonoscope was                        passed under direct vision. Throughout the procedure,                        the patient's blood pressure, pulse, and oxygen                        saturations were monitored continuously. The  Colonoscope was introduced through the anus and                        advanced to the the cecum, identified by appendiceal                        orifice and ileocecal valve. The colonoscopy was                        performed without difficulty. The patient  tolerated the                        procedure well. The quality of the bowel preparation                        was good. The ileocecal valve, appendiceal orifice, and                        rectum were photographed. Findings:      The perianal and digital rectal examinations were normal. Pertinent       negatives include normal sphincter tone and no palpable rectal lesions.      Normal mucosa was found in the entire colon.      Non-bleeding internal hemorrhoids were found during retroflexion. The       hemorrhoids were Grade I (internal hemorrhoids that do not prolapse).      The exam was otherwise without abnormality. Impression:           - Normal mucosa in the entire examined colon.                       - Non-bleeding internal hemorrhoids.                       - The examination was otherwise normal.                       - No specimens collected. Recommendation:       - Patient has a contact number available for                        emergencies. The signs and symptoms of potential                        delayed complications were discussed with the patient.                        Return to normal activities tomorrow. Written discharge                        instructions were provided to the patient.                       - Resume previous diet.                       - Continue present medications.                       - Repeat colonoscopy in 10 years for screening purposes.                       -  Return to GI office PRN.                       - The findings and recommendations were discussed with                        the patient and their spouse. Procedure Code(s):    --- Professional ---                       Z6109, Colorectal cancer screening; colonoscopy on                        individual not meeting criteria for high risk Diagnosis Code(s):    --- Professional ---                       K64.0, First degree hemorrhoids                       Z12.11, Encounter for  screening for malignant neoplasm                        of colon CPT copyright 2016 American Medical Association. All rights reserved. The codes documented in this report are preliminary and upon coder review may  be revised to meet current compliance requirements. Stanton Kidney MD, MD 02/05/2018 8:29:48 AM This report has been signed electronically. Number of Addenda: 0 Note Initiated On: 02/05/2018 7:28 AM Scope Withdrawal Time: 0 hours 10 minutes 1 second  Total Procedure Duration: 0 hours 14 minutes 28 seconds       [facility]

## 2018-02-05 NOTE — Anesthesia Postprocedure Evaluation (Signed)
Anesthesia Post Note  Patient: Billy Scott  Procedure(s) Performed: COLONOSCOPY WITH PROPOFOL (N/A )  Patient location during evaluation: Endoscopy Anesthesia Type: General Level of consciousness: awake and alert Pain management: pain level controlled Vital Signs Assessment: post-procedure vital signs reviewed and stable Respiratory status: spontaneous breathing, nonlabored ventilation, respiratory function stable and patient connected to nasal cannula oxygen Cardiovascular status: blood pressure returned to baseline and stable Postop Assessment: no apparent nausea or vomiting Anesthetic complications: no     Last Vitals:  Vitals:   02/05/18 0853 02/05/18 0903  BP: 122/83 116/82  Pulse: 71 (!) 55  Resp: 14 15  Temp:    SpO2: 95% 96%    Last Pain:  Vitals:   02/05/18 0650  TempSrc: Tympanic                 Gid Schoffstall Billy Scott Elkin Belfield

## 2018-02-05 NOTE — Anesthesia Post-op Follow-up Note (Signed)
Anesthesia QCDR form completed.        

## 2018-02-05 NOTE — H&P (Signed)
  Outpatient short stay form Pre-procedure 02/05/2018 7:59 AM Billy Scott K. Norma Fredricksonoledo, M.D.  Primary Physician: Lorain ChildesJames Donald Strickland, M.D./Joseph Ellin Goodieabinowitz, M.D.  Reason for visit:  Colon cancer screening  History of present illness:  62 year old male presents for colorectal cancer screening. Patient denies any abdominal pain change of bowel habits or rectal bleeding.    Current Facility-Administered Medications:  .  0.9 %  sodium chloride infusion, , Intravenous, Continuous, Pacific[place]edo, [name], MD, Last Rate: 20 mL/hr at 02/05/18 0700  Medications Prior to Admission  Medication Sig Dispense Refill Last Dose  . bisoprolol-hydrochlorothiazide (ZIAC) 5-6.25 MG tablet Take 1 tablet by mouth daily.   02/05/2018 at 0615  . doxycycline (VIBRA-TABS) 100 MG tablet Take 100 mg by mouth 2 (two) times daily.  0 Past Week at Unknown time  . ipratropium (ATROVENT) 0.06 % nasal spray 2 SPRAYS IN EACH NOSTRIL 3 TIMES A DAY AS NEEDED  0 Past Week at Unknown time  . phentermine 37.5 MG capsule Take 37.5 mg by mouth every morning.   Past Week at Unknown time  . Saxagliptin-Metformin (KOMBIGLYZE XR) 04-999 MG TB24 Take 1 tablet by mouth daily.   Past Week at Unknown time  . sildenafil (REVATIO) 20 MG tablet Take 20 mg by mouth 3 (three) times daily.   Past Week at Unknown time     Allergies  Allergen Reactions  . Penicillins Hives and Other (See Comments)    Has patient had a PCN reaction causing immediate rash, facial/tongue/throat swelling, SOB or lightheadedness with hypotension: No Has patient had a PCN reaction causing severe rash involving mucus membranes or skin necrosis: No Has patient had a PCN reaction that required hospitalization No Has patient had a PCN reaction occurring within the last 10 years: No If all of the above answers are "NO", then may proceed with Cephalosporin use.     Past Medical History:  Diagnosis Date  . Arthritis   . Diabetes mellitus without complication (HCC)   .  Hypertension   . Kidney stone     Review of systems:   Negative   Physical Exam  General appearance: alert, cooperative and appears stated age Resp: clear to auscultation bilaterally Cardio: regular rate and rhythm, S1, S2 normal, no murmur, click, rub or gallop GI: soft, non-tender; bowel sounds normal; no masses,  no organomegaly Extremities: extremities normal, atraumatic, no cyanosis or edema     Planned procedures: Proceed with colonoscopy. The patient understands the nature of the planned procedure, indications, risks, alternatives and potential complications including but not limited to bleeding, infection, perforation, damage to internal organs and possible oversedation/side effects from anesthesia. The patient agrees and gives consent to proceed.  Please refer to procedure notes for findings, recommendations and patient disposition/instructions.    Deveney Bayon K. Norma Fredricksonoledo, M.D. Gastroenterology 02/05/2018  7:59 AM

## 2018-02-05 NOTE — Anesthesia Preprocedure Evaluation (Signed)
Anesthesia Evaluation  Patient identified by MRN, date of birth, ID band Patient awake    Reviewed: Allergy & Precautions, H&P , NPO status , reviewed documented beta blocker date and time   Airway Mallampati: II  TM Distance: >3 FB     Dental  (+) Teeth Intact, Missing   Pulmonary    Pulmonary exam normal        Cardiovascular hypertension, Normal cardiovascular exam     Neuro/Psych    GI/Hepatic   Endo/Other  diabetes  Renal/GU Renal disease     Musculoskeletal   Abdominal   Peds  Hematology   Anesthesia Other Findings   Reproductive/Obstetrics                             Anesthesia Physical Anesthesia Plan  ASA: II  Anesthesia Plan: General   Post-op Pain Management:    Induction:   PONV Risk Score and Plan: 2 and Propofol infusion  Airway Management Planned:   Additional Equipment:   Intra-op Plan:   Post-operative Plan:   Informed Consent: I have reviewed the patients History and Physical, chart, labs and discussed the procedure including the risks, benefits and alternatives for the proposed anesthesia with the patient or authorized representative who has indicated his/her understanding and acceptance.   Dental Advisory Given  Plan Discussed with: CRNA  Anesthesia Plan Comments:         Anesthesia Quick Evaluation

## 2018-02-05 NOTE — Interval H&P Note (Signed)
History and Physical Interval Note:  02/05/2018 8:02 AM  Billy Scott  has presented today for surgery, with the diagnosis of COLON CANCER SCREENING  The various methods of treatment have been discussed with the patient and family. After consideration of risks, benefits and other options for treatment, the patient has consented to  Procedure(s): COLONOSCOPY WITH PROPOFOL (N/A) as a surgical intervention .  The patient's history has been reviewed, patient examined, no change in status, stable for surgery.  I have reviewed the patient's chart and labs.  Questions were answered to the patient's satisfaction.     [place], [place]

## 2018-02-06 ENCOUNTER — Encounter: Payer: Self-pay | Admitting: Internal Medicine

## 2018-02-19 LAB — LIPID PANEL
Cholesterol: 212 — AB (ref 0–200)
HDL: 32 — AB (ref 35–70)
Triglycerides: 431 — AB (ref 40–160)

## 2018-02-19 LAB — HEMOGLOBIN A1C: Hemoglobin A1C: 8.7

## 2018-09-16 LAB — HEMOGLOBIN A1C: Hemoglobin A1C: 10.1

## 2018-09-16 LAB — LIPID PANEL
Cholesterol: 229 — AB (ref 0–200)
HDL: 28 — AB (ref 35–70)
Triglycerides: 561 — AB (ref 40–160)

## 2018-12-23 LAB — LIPID PANEL
Cholesterol: 190 (ref 0–200)
HDL: 34 — AB (ref 35–70)
LDL Cholesterol: 112
Triglycerides: 221 — AB (ref 40–160)

## 2018-12-23 LAB — HEMOGLOBIN A1C: Hemoglobin A1C: 9.4

## 2018-12-23 LAB — PSA: PSA: 0.6

## 2019-04-08 ENCOUNTER — Other Ambulatory Visit: Payer: Self-pay

## 2019-04-08 LAB — LIPID PANEL
Cholesterol: 161 (ref 0–200)
HDL: 29 — AB (ref 35–70)
LDL Cholesterol: 69

## 2019-04-09 LAB — HGB A1C W/O EAG: Hgb A1c MFr Bld: 12.4 % — ABNORMAL HIGH (ref 4.8–5.6)

## 2019-04-09 LAB — MICROALBUMIN / CREATININE URINE RATIO
Creatinine, Urine: 106.3 mg/dL
Microalb/Creat Ratio: 22 mg/g creat (ref 0–29)
Microalbumin, Urine: 23 ug/mL

## 2019-04-09 LAB — LIPID PANEL W/O CHOL/HDL RATIO
Cholesterol, Total: 161 mg/dL (ref 100–199)
HDL: 29 mg/dL — ABNORMAL LOW (ref >39)
LDL Calculated: 69 mg/dL (ref 0–99)
Triglycerides: 317 mg/dL — ABNORMAL HIGH (ref 0–149)
VLDL Cholesterol Cal: 63 mg/dL — ABNORMAL HIGH (ref 5–40)

## 2019-06-26 LAB — MICROALBUMIN / CREATININE URINE RATIO
Albumin ELP, Urine: 23
Albumin Ratio: 22
Creatinine, Ur: 106.3 mg/dL

## 2019-06-26 LAB — TESTOSTERONE: Testosterone: 349

## 2019-06-29 ENCOUNTER — Other Ambulatory Visit: Payer: Self-pay

## 2019-06-29 ENCOUNTER — Ambulatory Visit: Payer: 59 | Admitting: Internal Medicine

## 2019-06-29 ENCOUNTER — Encounter: Payer: Self-pay | Admitting: Internal Medicine

## 2019-06-29 VITALS — BP 118/79 | HR 66 | Temp 98.0°F | Resp 14 | Ht 70.0 in | Wt 207.0 lb

## 2019-06-29 DIAGNOSIS — E6609 Other obesity due to excess calories: Secondary | ICD-10-CM | POA: Insufficient documentation

## 2019-06-29 DIAGNOSIS — I10 Essential (primary) hypertension: Secondary | ICD-10-CM

## 2019-06-29 DIAGNOSIS — E119 Type 2 diabetes mellitus without complications: Secondary | ICD-10-CM

## 2019-06-29 DIAGNOSIS — E7849 Other hyperlipidemia: Secondary | ICD-10-CM

## 2019-06-29 DIAGNOSIS — E785 Hyperlipidemia, unspecified: Secondary | ICD-10-CM | POA: Insufficient documentation

## 2019-06-29 DIAGNOSIS — Z6830 Body mass index (BMI) 30.0-30.9, adult: Secondary | ICD-10-CM | POA: Insufficient documentation

## 2019-06-29 LAB — POCT GLYCOSYLATED HEMOGLOBIN (HGB A1C): Hemoglobin A1C: 8.2 % — AB (ref 4.0–5.6)

## 2019-06-29 NOTE — Progress Notes (Signed)
S - Patient with a h/o NIDDM who follows-up for the every 3 month check-up.  Has been feeling well with no complaints Taking the medicines for his DM regularly, the newest one is Jardiance.  Has been losing weight, noted will be less than 200 after this week.  No concerning sx's, with no CP, SOB, numbness or tingling in the extremities. Not seen his eye doctor in past year, had eye surgery in the past and noted much improvement in acuity after. Has an eye doctor that has been following  Trying to eat a healthy diet and manage weight successfully Also more active  No tob use  Current Outpatient Medications on File Prior to Visit  Medication Sig Dispense Refill  . atorvastatin (LIPITOR) 20 MG tablet Take 20 mg by mouth daily.    . bisoprolol-hydrochlorothiazide (ZIAC) 5-6.25 MG tablet Take 1 tablet by mouth daily.    Marland Kitchen JARDIANCE 10 MG TABS tablet Take 10 mg by mouth every morning.    Marland Kitchen KOMBIGLYZE XR 2.04-999 MG TB24 Take 2 tablets by mouth daily.    . sildenafil (REVATIO) 20 MG tablet Take 20 mg by mouth 3 (three) times daily.     No current facility-administered medications on file prior to visit.      Allergies  Allergen Reactions  . Penicillins Hives and Other (See Comments)    Has patient had a PCN reaction causing immediate rash, facial/tongue/throat swelling, SOB or lightheadedness with hypotension: No Has patient had a PCN reaction causing severe rash involving mucus membranes or skin necrosis: No Has patient had a PCN reaction that required hospitalization No Has patient had a PCN reaction occurring within the last 10 years: No If all of the above answers are "NO", then may proceed with Cephalosporin use.    Traveled to [facility] to pick up a truck last week [place], [facility]), no concerns for Covid sx's in recent past.  O - NAD, masked  BP 118/79 (BP Location: Left Arm, Patient Position: Sitting, Cuff Size: Large)   Pulse 66   Temp 98 F (36.7 C) (Oral)   Resp 14   Ht 5\' 10"   (1.778 m)   Wt 207 lb (93.9 kg)   SpO2 96%   BMI 29.70 kg/m    Weight 222 lb Jan 2020.   HEENT - sclera anicteric, PERRL, EOMI Car - RRR without m/g/r Pulm - CTA Abd - obese,  Ext - no LE edema Neuro - Affect not flat, approp with conversation  Grossly nonfocal  Labs reviewed including prior A1C's, 8.2 today (12.4 in April, 9.4 in Jan)  A/P - 1. NIDDM - better controlled recent past, on three meds to manage at present  Continue current medications Importance of diet modifications and staying active important and to continue Above to help with weight control/weight loss also important  Discussed potential next steps if not staying better controlled and why important to keep sugars very well controlled to help protect the heart, kidneys, and lessen future complications. Rec'ed an eye MD visit annually with DM noted, to check for retinopathy complication important F/u again in 3 months for rechecks  2. HTN - controlled on medicines  Continue medications to manage Importance of healthy diet and regular aerobic exercise and weight control noted Continue to monitor  3. Obesity - losing weight successfully and helping with above noted, continue with weight reduction as planned and encouraged  4. Hyperlipidemia -  On a statin and continue,  LDL- 69 on last check 03/2019

## 2019-07-06 ENCOUNTER — Telehealth: Payer: Self-pay

## 2019-07-06 NOTE — Telephone Encounter (Signed)
[facility] is requesting to change one of Billy Scott's medications.  Currently takes Kombiglyze XR 2.04-999 mg 2 tabs po qd.  [facility] states insurance requires Janumet XR 50-1000.  AMD

## 2019-07-06 NOTE — Telephone Encounter (Signed)
Please let them know that change is ok.

## 2019-07-06 NOTE — Telephone Encounter (Signed)
Called [facility] & spoke with Alyssa & told her Dr. Roxan Hockey authorized the change in medication from [place] XR to Janumet XR.  AMD

## 2019-10-20 ENCOUNTER — Other Ambulatory Visit: Payer: Self-pay

## 2019-10-20 DIAGNOSIS — E119 Type 2 diabetes mellitus without complications: Secondary | ICD-10-CM

## 2019-10-20 NOTE — Telephone Encounter (Signed)
Noted; holding on fill until HgbA1c now test in clinic tomorrow.

## 2019-10-21 ENCOUNTER — Encounter: Payer: Self-pay | Admitting: Registered Nurse

## 2019-10-21 ENCOUNTER — Other Ambulatory Visit: Payer: Self-pay

## 2019-10-21 ENCOUNTER — Ambulatory Visit: Payer: 59 | Admitting: Registered Nurse

## 2019-10-21 VITALS — BP 114/76 | HR 68 | Temp 98.0°F | Resp 16 | Ht 70.5 in | Wt 211.0 lb

## 2019-10-21 DIAGNOSIS — Z0289 Encounter for other administrative examinations: Secondary | ICD-10-CM

## 2019-10-21 DIAGNOSIS — H9191 Unspecified hearing loss, right ear: Secondary | ICD-10-CM | POA: Insufficient documentation

## 2019-10-21 DIAGNOSIS — I10 Essential (primary) hypertension: Secondary | ICD-10-CM

## 2019-10-21 DIAGNOSIS — Z6829 Body mass index (BMI) 29.0-29.9, adult: Secondary | ICD-10-CM

## 2019-10-21 DIAGNOSIS — E119 Type 2 diabetes mellitus without complications: Secondary | ICD-10-CM

## 2019-10-21 DIAGNOSIS — E663 Overweight: Secondary | ICD-10-CM | POA: Insufficient documentation

## 2019-10-21 LAB — POCT GLYCOSYLATED HEMOGLOBIN (HGB A1C)
HbA1c, POC (controlled diabetic range): 8 % — AB (ref 0.0–7.0)
Hemoglobin A1C: 8 % — AB (ref 4.0–5.6)

## 2019-10-21 LAB — POCT URINALYSIS DIPSTICK
Bilirubin, UA: NEGATIVE
Blood, UA: NEGATIVE
Glucose, UA: POSITIVE — AB
Ketones, UA: NEGATIVE
Leukocytes, UA: NEGATIVE
Nitrite, UA: NEGATIVE
Protein, UA: NEGATIVE
Spec Grav, UA: 1.02 (ref 1.010–1.025)
Urobilinogen, UA: 0.2 E.U./dL
pH, UA: 5 (ref 5.0–8.0)

## 2019-10-21 MED ORDER — JANUMET 50-1000 MG PO TABS: 1.0000 | ORAL_TABLET | Freq: Two times a day (BID) | ORAL | 0 refills | Status: DC

## 2019-10-21 NOTE — Patient Instructions (Addendum)
 Calorie Counting for Weight Loss Calories are units of energy. Your body needs a certain amount of calories from food to keep you going throughout the day. When you eat more calories than your body needs, your body stores the extra calories as fat. When you eat fewer calories than your body needs, your body burns fat to get the energy it needs. Calorie counting means keeping track of how many calories you eat and drink each day. Calorie counting can be helpful if you need to lose weight. If you make sure to eat fewer calories than your body needs, you should lose weight. Ask your health care provider what a healthy weight is for you. For calorie counting to work, you will need to eat the right number of calories in a day in order to lose a healthy amount of weight per week. A dietitian can help you determine how many calories you need in a day and will give you suggestions on how to reach your calorie goal.  A healthy amount of weight to lose per week is usually 1-2 lb (0.5-0.9 kg). This usually means that your daily calorie intake should be reduced by 500-750 calories.  Eating 1,200 - 1,500 calories per day can help most women lose weight.  Eating 1,500 - 1,800 calories per day can help most men lose weight. What is my plan? My goal is to have __________ calories per day. If I have this many calories per day, I should lose around __________ pounds per week. What do I need to know about calorie counting? In order to meet your daily calorie goal, you will need to:  Find out how many calories are in each food you would like to eat. Try to do this before you eat.  Decide how much of the food you plan to eat.  Write down what you ate and how many calories it had. Doing this is called keeping a food log. To successfully lose weight, it is important to balance calorie counting with a healthy lifestyle that includes regular activity. Aim for 150 minutes of moderate exercise (such as walking) or 75  minutes of vigorous exercise (such as running) each week. Where do I find calorie information?  The number of calories in a food can be found on a Nutrition Facts label. If a food does not have a Nutrition Facts label, try to look up the calories online or ask your dietitian for help. Remember that calories are listed per serving. If you choose to have more than one serving of a food, you will have to multiply the calories per serving by the amount of servings you plan to eat. For example, the label on a package of bread might say that a serving size is 1 slice and that there are 90 calories in a serving. If you eat 1 slice, you will have eaten 90 calories. If you eat 2 slices, you will have eaten 180 calories. How do I keep a food log? Immediately after each meal, record the following information in your food log:  What you ate. Don't forget to include toppings, sauces, and other extras on the food.  How much you ate. This can be measured in cups, ounces, or number of items.  How many calories each food and drink had.  The total number of calories in the meal. Keep your food log near you, such as in a small notebook in your pocket, or use a mobile app or website. Some programs will   calculate calories for you and show you how many calories you have left for the day to meet your goal. What are some calorie counting tips?   Use your calories on foods and drinks that will fill you up and not leave you hungry: ? Some examples of foods that fill you up are nuts and nut butters, vegetables, lean proteins, and high-fiber foods like whole grains. High-fiber foods are foods with more than 5 g fiber per serving. ? Drinks such as sodas, specialty coffee drinks, alcohol, and juices have a lot of calories, yet do not fill you up.  Eat nutritious foods and avoid empty calories. Empty calories are calories you get from foods or beverages that do not have many vitamins or protein, such as candy, sweets, and  soda. It is better to have a nutritious high-calorie food (such as an avocado) than a food with few nutrients (such as a bag of chips).  Know how many calories are in the foods you eat most often. This will help you calculate calorie counts faster.  Pay attention to calories in drinks. Low-calorie drinks include water and unsweetened drinks.  Pay attention to nutrition labels for "low fat" or "fat free" foods. These foods sometimes have the same amount of calories or more calories than the full fat versions. They also often have added sugar, starch, or salt, to make up for flavor that was removed with the fat.  Find a way of tracking calories that works for you. Get creative. Try different apps or programs if writing down calories does not work for you. What are some portion control tips?  Know how many calories are in a serving. This will help you know how many servings of a certain food you can have.  Use a measuring cup to measure serving sizes. You could also try weighing out portions on a kitchen scale. With time, you will be able to estimate serving sizes for some foods.  Take some time to put servings of different foods on your favorite plates, bowls, and cups so you know what a serving looks like.  Try not to eat straight from a bag or box. Doing this can lead to overeating. Put the amount you would like to eat in a cup or on a plate to make sure you are eating the right portion.  Use smaller plates, glasses, and bowls to prevent overeating.  Try not to multitask (for example, watch TV or use your computer) while eating. If it is time to eat, sit down at a table and enjoy your food. This will help you to know when you are full. It will also help you to be aware of what you are eating and how much you are eating. What are tips for following this plan? Reading food labels  Check the calorie count compared to the serving size. The serving size may be smaller than what you are used to  eating.  Check the source of the calories. Make sure the food you are eating is high in vitamins and protein and low in saturated and trans fats. Shopping  Read nutrition labels while you shop. This will help you make healthy decisions before you decide to purchase your food.  Make a grocery list and stick to it. Cooking  Try to cook your favorite foods in a healthier way. For example, try baking instead of frying.  Use low-fat dairy products. Meal planning  Use more fruits and vegetables. Half of your plate should be  fruits and vegetables.  Include lean proteins like poultry and fish. How do I count calories when eating out?  Ask for smaller portion sizes.  Consider sharing an entree and sides instead of getting your own entree.  If you get your own entree, eat only half. Ask for a box at the beginning of your meal and put the rest of your entree in it so you are not tempted to eat it.  If calories are listed on the menu, choose the lower calorie options.  Choose dishes that include vegetables, fruits, whole grains, low-fat dairy products, and lean protein.  Choose items that are boiled, broiled, grilled, or steamed. Stay away from items that are buttered, battered, fried, or served with cream sauce. Items labeled "crispy" are usually fried, unless stated otherwise.  Choose water, low-fat milk, unsweetened iced tea, or other drinks without added sugar. If you want an alcoholic beverage, choose a lower calorie option such as a glass of wine or light beer.  Ask for dressings, sauces, and syrups on the side. These are usually high in calories, so you should limit the amount you eat.  If you want a salad, choose a garden salad and ask for grilled meats. Avoid extra toppings like bacon, cheese, or fried items. Ask for the dressing on the side, or ask for olive oil and vinegar or lemon to use as dressing.  Estimate how many servings of a food you are given. For example, a serving of  cooked rice is  cup or about the size of half a baseball. Knowing serving sizes will help you be aware of how much food you are eating at restaurants. The list below tells you how big or small some common portion sizes are based on everyday objects: ? 1 oz-4 stacked dice. ? 3 oz-1 deck of cards. ? 1 tsp-1 die. ? 1 Tbsp- a ping-pong ball. ? 2 Tbsp-1 ping-pong ball. ?  cup- baseball. ? 1 cup-1 baseball. Summary  Calorie counting means keeping track of how many calories you eat and drink each day. If you eat fewer calories than your body needs, you should lose weight.  A healthy amount of weight to lose per week is usually 1-2 lb (0.5-0.9 kg). This usually means reducing your daily calorie intake by 500-750 calories.  The number of calories in a food can be found on a Nutrition Facts label. If a food does not have a Nutrition Facts label, try to look up the calories online or ask your dietitian for help.  Use your calories on foods and drinks that will fill you up, and not on foods and drinks that will leave you hungry.  Use smaller plates, glasses, and bowls to prevent overeating. This information is not intended to replace advice given to you by your health care provider. Make sure you discuss any questions you have with your health care provider. Document Released: 12/03/2005 Document Revised: 08/22/2018 Document Reviewed: 11/02/2016 Elsevier Patient Education  2020 [employer]   Fat and Cholesterol Restricted Eating Plan Getting too much fat and cholesterol in your diet may cause health problems. Choosing the right foods helps keep your fat and cholesterol at normal levels. This can keep you from getting certain diseases. Your doctor may recommend an eating plan that includes:  Total fat: ______% or less of total calories a day.  Saturated fat: ______% or less of total calories a day.  Cholesterol: less than _________mg a day.  Fiber: ______g a day. What are tips   for  following this plan? Meal planning  At meals, divide your plate into four equal parts: ? Fill one-half of your plate with vegetables and green salads. ? Fill one-fourth of your plate with whole grains. ? Fill one-fourth of your plate with low-fat (lean) protein foods.  Eat fish that is high in omega-3 fats at least two times a week. This includes mackerel, tuna, sardines, and salmon.  Eat foods that are high in fiber, such as whole grains, beans, apples, broccoli, carrots, peas, and barley. General tips   Work with your doctor to lose weight if you need to.  Avoid: ? Foods with added sugar. ? Fried foods. ? Foods with partially hydrogenated oils.  Limit alcohol intake to no more than 1 drink a day for nonpregnant women and 2 drinks a day for men. One drink equals 12 oz of beer, 5 oz of wine, or 1 oz of hard liquor. Reading food labels  Check food labels for: ? Trans fats. ? Partially hydrogenated oils. ? Saturated fat (g) in each serving. ? Cholesterol (mg) in each serving. ? Fiber (g) in each serving.  Choose foods with healthy fats, such as: ? Monounsaturated fats. ? Polyunsaturated fats. ? Omega-3 fats.  Choose grain products that have whole grains. Look for the word "whole" as the first word in the ingredient list. Cooking  Cook foods using low-fat methods. These include baking, boiling, grilling, and broiling.  Eat more home-cooked foods. Eat at restaurants and buffets less often.  Avoid cooking using saturated fats, such as butter, cream, palm oil, palm kernel oil, and coconut oil. Recommended foods  Fruits  All fresh, canned (in natural juice), or frozen fruits. Vegetables  Fresh or frozen vegetables (raw, steamed, roasted, or grilled). Green salads. Grains  Whole grains, such as whole wheat or whole grain breads, crackers, cereals, and pasta. Unsweetened oatmeal, bulgur, barley, quinoa, or brown rice. Corn or whole wheat flour tortillas. Meats and other  protein foods  Ground beef (85% or leaner), grass-fed beef, or beef trimmed of fat. Skinless chicken or turkey. Ground chicken or turkey. Pork trimmed of fat. All fish and seafood. Egg whites. Dried beans, peas, or lentils. Unsalted nuts or seeds. Unsalted canned beans. Nut butters without added sugar or oil. Dairy  Low-fat or nonfat dairy products, such as skim or 1% milk, 2% or reduced-fat cheeses, low-fat and fat-free ricotta or cottage cheese, or plain low-fat and nonfat yogurt. Fats and oils  Tub margarine without trans fats. Light or reduced-fat mayonnaise and salad dressings. Avocado. Olive, canola, sesame, or safflower oils. The items listed above may not be a complete list of foods and beverages you can eat. Contact a dietitian for more information. Foods to avoid Fruits  Canned fruit in heavy syrup. Fruit in cream or butter sauce. Fried fruit. Vegetables  Vegetables cooked in cheese, cream, or butter sauce. Fried vegetables. Grains  White bread. White pasta. White rice. Cornbread. Bagels, pastries, and croissants. Crackers and snack foods that contain trans fat and hydrogenated oils. Meats and other protein foods  Fatty cuts of meat. Ribs, chicken wings, bacon, sausage, bologna, salami, chitterlings, fatback, hot dogs, bratwurst, and packaged lunch meats. Liver and organ meats. Whole eggs and egg yolks. Chicken and turkey with skin. Fried meat. Dairy  Whole or 2% milk, cream, half-and-half, and cream cheese. Whole milk cheeses. Whole-fat or sweetened yogurt. Full-fat cheeses. Nondairy creamers and whipped toppings. Processed cheese, cheese spreads, and cheese curds. Beverages  Alcohol. Sugar-sweetened drinks such as sodas,   lemonade, and fruit drinks. Fats and oils  Butter, stick margarine, lard, shortening, ghee, or bacon fat. Coconut, palm kernel, and palm oils. Sweets and desserts  Corn syrup, sugars, honey, and molasses. Candy. Jam and jelly. Syrup. Sweetened cereals.  Cookies, pies, cakes, donuts, muffins, and ice cream. The items listed above may not be a complete list of foods and beverages you should avoid. Contact a dietitian for more information. Summary  Choosing the right foods helps keep your fat and cholesterol at normal levels. This can keep you from getting certain diseases.  At meals, fill one-half of your plate with vegetables and green salads.  Eat high-fiber foods, like whole grains, beans, apples, carrots, peas, and barley.  Limit added sugar, saturated fats, alcohol, and fried foods. This information is not intended to replace advice given to you by your health care provider. Make sure you discuss any questions you have with your health care provider. Document Released: 06/03/2012 Document Revised: 08/06/2018 Document Reviewed: 08/20/2017 Elsevier Patient Education  2020 [employer].    Why follow it? Research shows. . Those who follow the Mediterranean diet have a reduced risk of heart disease  . The diet is associated with a reduced incidence of Parkinson's and Alzheimer's diseases . People following the diet may have longer life expectancies and lower rates of chronic diseases  . The Dietary Guidelines for Americans recommends the Mediterranean diet as an eating plan to promote health and prevent disease  What Is the Mediterranean Diet?  . Healthy eating plan based on typical foods and recipes of Mediterranean-style cooking . The diet is primarily a plant based diet; these foods should make up a majority of meals   Starches - Plant based foods should make up a majority of meals - They are an important sources of vitamins, minerals, energy, antioxidants, and fiber - Choose whole grains, foods high in fiber and minimally processed items  - Typical grain sources include wheat, oats, barley, corn, brown rice, bulgar, farro, millet, polenta, couscous  - Various types of beans include chickpeas, lentils, fava beans, black beans, white  beans   Fruits  Veggies - Large quantities of antioxidant rich fruits & veggies; 6 or more servings  - Vegetables can be eaten raw or lightly drizzled with oil and cooked  - Vegetables common to the traditional Mediterranean Diet include: artichokes, arugula, beets, broccoli, brussel sprouts, cabbage, carrots, celery, collard greens, cucumbers, eggplant, kale, leeks, lemons, lettuce, mushrooms, okra, onions, peas, peppers, potatoes, pumpkin, radishes, rutabaga, shallots, spinach, sweet potatoes, turnips, zucchini - Fruits common to the Mediterranean Diet include: apples, apricots, avocados, cherries, clementines, dates, figs, grapefruits, grapes, melons, nectarines, oranges, peaches, pears, pomegranates, strawberries, tangerines  Fats - Replace butter and margarine with healthy oils, such as olive oil, canola oil, and tahini  - Limit nuts to no more than a handful a day  - Nuts include walnuts, almonds, pecans, pistachios, pine nuts  - Limit or avoid candied, honey roasted or heavily salted nuts - Olives are central to the [employer] - can be eaten whole or used in a variety of dishes   Meats Protein - Limiting red meat: no more than a few times a month - When eating red meat: choose lean cuts and keep the portion to the size of deck of cards - Eggs: approx. 0 to 4 times a week  - Fish and lean poultry: at least 2 a week  - Healthy protein sources include, chicken, [place], lean beef, lamb - Increase intake  of seafood such as tuna, salmon, trout, mackerel, shrimp, scallops - Avoid or limit high fat processed meats such as sausage and bacon  Dairy - Include moderate amounts of low fat dairy products  - Focus on healthy dairy such as fat free yogurt, skim milk, low or reduced fat cheese - Limit dairy products higher in fat such as whole or 2% milk, cheese, ice cream  Alcohol - Moderate amounts of red wine is ok  - No more than 5 oz daily for women (all ages) and men older than age 18  - No  more than 10 oz of wine daily for men younger than 60  Other - Limit sweets and other desserts  - Use herbs and spices instead of salt to flavor foods  - Herbs and spices common to the traditional Mediterranean Diet include: basil, bay leaves, chives, cloves, cumin, fennel, garlic, lavender, marjoram, mint, oregano, parsley, pepper, rosemary, sage, savory, sumac, tarragon, thyme   It's not just a diet, it's a lifestyle:  . The Mediterranean diet includes lifestyle factors typical of those in the region  . Foods, drinks and meals are best eaten with others and savored . Daily physical activity is important for overall good health . This could be strenuous exercise like running and aerobics . This could also be more leisurely activities such as walking, housework, yard-work, or taking the stairs . Moderation is the key; a balanced and healthy diet accommodates most foods and drinks . Consider portion sizes and frequency of consumption of certain foods   Meal Ideas & Options:  . Breakfast:  o Whole wheat toast or whole wheat English muffins with peanut butter & hard boiled egg o Steel cut oats topped with apples & cinnamon and skim milk  o Fresh fruit: banana, strawberries, melon, berries, peaches  o Smoothies: strawberries, bananas, greek yogurt, peanut butter o Low fat greek yogurt with blueberries and granola  o Egg white omelet with spinach and mushrooms o Breakfast couscous: whole wheat couscous, apricots, skim milk, cranberries  . Sandwiches:  o Hummus and grilled vegetables (peppers, zucchini, squash) on whole wheat bread   o Grilled chicken on whole wheat pita with lettuce, tomatoes, cucumbers or tzatziki  o Tuna salad on whole wheat bread: tuna salad made with greek yogurt, olives, red peppers, capers, green onions o Garlic rosemary lamb pita: lamb sauted with garlic, rosemary, salt & pepper; add lettuce, cucumber, greek yogurt to pita - flavor with lemon juice and black pepper  .  Seafood:  o Mediterranean grilled salmon, seasoned with garlic, basil, parsley, lemon juice and black pepper o Shrimp, lemon, and spinach whole-grain pasta salad made with low fat greek yogurt  o Seared scallops with lemon orzo  o Seared tuna steaks seasoned salt, pepper, coriander topped with tomato mixture of olives, tomatoes, olive oil, minced garlic, parsley, green onions and cappers  . Meats:  o Herbed greek chicken salad with kalamata olives, cucumber, feta  o Red bell peppers stuffed with spinach, bulgur, lean ground beef (or lentils) & topped with feta   o Kebabs: skewers of chicken, tomatoes, onions, zucchini, squash  o [place] burgers: made with red onions, mint, dill, lemon juice, feta cheese topped with roasted red peppers . Vegetarian o Cucumber salad: cucumbers, artichoke hearts, celery, red onion, feta cheese, tossed in olive oil & lemon juice  o Hummus and whole grain pita points with a greek salad (lettuce, tomato, feta, olives, cucumbers, red onion) o Lentil soup with celery, carrots made  with vegetable broth, garlic, salt and pepper  o Tabouli salad: parsley, bulgur, mint, scallions, cucumbers, tomato, radishes, lemon juice, olive oil, salt and pepper.      Mediterranean Diet A Mediterranean diet refers to food and lifestyle choices that are based on the traditions of countries located on the [employer]. This way of eating has been shown to help prevent certain conditions and improve outcomes for people who have chronic diseases, like kidney disease and heart disease. What are tips for following this plan? Lifestyle  Cook and eat meals together with your family, when possible.  Drink enough fluid to keep your urine clear or pale yellow.  Be physically active every day. This includes: ? Aerobic exercise like running or swimming. ? Leisure activities like gardening, walking, or housework.  Get 7-8 hours of sleep each night.  If recommended by your health care  provider, drink red wine in moderation. This means 1 glass a day for nonpregnant women and 2 glasses a day for men. A glass of wine equals 5 oz (150 mL). Reading food labels   Check the serving size of packaged foods. For foods such as rice and pasta, the serving size refers to the amount of cooked product, not dry.  Check the total fat in packaged foods. Avoid foods that have saturated fat or trans fats.  Check the ingredients list for added sugars, such as corn syrup. Shopping  At the grocery store, buy most of your food from the areas near the walls of the store. This includes: ? Fresh fruits and vegetables (produce). ? Grains, beans, nuts, and seeds. Some of these may be available in unpackaged forms or large amounts (in bulk). ? Fresh seafood. ? Poultry and eggs. ? Low-fat dairy products.  Buy whole ingredients instead of prepackaged foods.  Buy fresh fruits and vegetables in-season from local farmers markets.  Buy frozen fruits and vegetables in resealable bags.  If you do not have access to quality fresh seafood, buy precooked frozen shrimp or canned fish, such as tuna, salmon, or sardines.  Buy small amounts of raw or cooked vegetables, salads, or olives from the deli or salad bar at your store.  Stock your pantry so you always have certain foods on hand, such as olive oil, canned tuna, canned tomatoes, rice, pasta, and beans. Cooking  Cook foods with extra-virgin olive oil instead of using butter or other vegetable oils.  Have meat as a side dish, and have vegetables or grains as your main dish. This means having meat in small portions or adding small amounts of meat to foods like pasta or stew.  Use beans or vegetables instead of meat in common dishes like chili or lasagna.  Experiment with different cooking methods. Try roasting or broiling vegetables instead of steaming or sauteing them.  Add frozen vegetables to soups, stews, pasta, or rice.  Add nuts or seeds  for added healthy fat at each meal. You can add these to yogurt, salads, or vegetable dishes.  Marinate fish or vegetables using olive oil, lemon juice, garlic, and fresh herbs. Meal planning   Plan to eat 1 vegetarian meal one day each week. Try to work up to 2 vegetarian meals, if possible.  Eat seafood 2 or more times a week.  Have healthy snacks readily available, such as: ? Vegetable sticks with hummus. ? [place] yogurt. ? Fruit and nut trail mix.  Eat balanced meals throughout the week. This includes: ? Fruit: 2-3 servings a day ? Vegetables:  4-5 servings a day ? Low-fat dairy: 2 servings a day ? Fish, poultry, or lean meat: 1 serving a day ? Beans and legumes: 2 or more servings a week ? Nuts and seeds: 1-2 servings a day ? Whole grains: 6-8 servings a day ? Extra-virgin olive oil: 3-4 servings a day  Limit red meat and sweets to only a few servings a month What are my food choices?  Mediterranean diet ? Recommended  Grains: Whole-grain pasta. Brown rice. Bulgar wheat. Polenta. Couscous. Whole-wheat bread. Orpah Cobb.  Vegetables: Artichokes. Beets. Broccoli. Cabbage. Carrots. Eggplant. Green beans. Chard. Kale. Spinach. Onions. Leeks. Peas. Squash. Tomatoes. Peppers. Radishes.  Fruits: Apples. Apricots. Avocado. Berries. Bananas. Cherries. Dates. Figs. Grapes. Lemons. Melon. Oranges. Peaches. Plums. Pomegranate.  Meats and other protein foods: Beans. Almonds. Sunflower seeds. Pine nuts. Peanuts. Cod. Salmon. Scallops. Shrimp. Tuna. Tilapia. Clams. Oysters. Eggs.  Dairy: Low-fat milk. Cheese. Greek yogurt.  Beverages: Water. Red wine. Herbal tea.  Fats and oils: Extra virgin olive oil. Avocado oil. Grape seed oil.  Sweets and desserts: [place] yogurt with honey. Baked apples. Poached pears. Trail mix.  Seasoning and other foods: Basil. Cilantro. Coriander. Cumin. Mint. Parsley. Sage. Rosemary. Tarragon. Garlic. Oregano. Thyme. Pepper. Balsalmic vinegar. Tahini.  Hummus. Tomato sauce. Olives. Mushrooms. ? Limit these  Grains: Prepackaged pasta or rice dishes. Prepackaged cereal with added sugar.  Vegetables: Deep fried potatoes (french fries).  Fruits: Fruit canned in syrup.  Meats and other protein foods: Beef. Pork. Lamb. Poultry with skin. Hot dogs. Tomasa Blase.  Dairy: Ice cream. Sour cream. Whole milk.  Beverages: Juice. Sugar-sweetened soft drinks. Beer. Liquor and spirits.  Fats and oils: Butter. Canola oil. Vegetable oil. Beef fat (tallow). Lard.  Sweets and desserts: Cookies. Cakes. Pies. Candy.  Seasoning and other foods: Mayonnaise. Premade sauces and marinades. The items listed may not be a complete list. Talk with your dietitian about what dietary choices are right for you. Summary  The Mediterranean diet includes both food and lifestyle choices.  Eat a variety of fresh fruits and vegetables, beans, nuts, seeds, and whole grains.  Limit the amount of red meat and sweets that you eat.  Talk with your health care provider about whether it is safe for you to drink red wine in moderation. This means 1 glass a day for nonpregnant women and 2 glasses a day for men. A glass of wine equals 5 oz (150 mL). This information is not intended to replace advice given to you by your health care provider. Make sure you discuss any questions you have with your health care provider. Document Released: 07/26/2016 Document Revised: 08/02/2016 Document Reviewed: 07/26/2016 Elsevier Patient Education  2020 [employer]  DASH Eating Plan DASH stands for "Dietary Approaches to Stop Hypertension." The DASH eating plan is a healthy eating plan that has been shown to reduce high blood pressure (hypertension). It may also reduce your risk for type 2 diabetes, heart disease, and stroke. The DASH eating plan may also help with weight loss. What are tips for following this plan?  General guidelines  Avoid eating more than 2,300 mg (milligrams) of salt  (sodium) a day. If you have hypertension, you may need to reduce your sodium intake to 1,500 mg a day.  Limit alcohol intake to no more than 1 drink a day for nonpregnant women and 2 drinks a day for men. One drink equals 12 oz of beer, 5 oz of wine, or 1 oz of hard liquor.  Work with your health care  provider to maintain a healthy body weight or to lose weight. Ask what an ideal weight is for you.  Get at least 30 minutes of exercise that causes your heart to beat faster (aerobic exercise) most days of the week. Activities may include walking, swimming, or biking.  Work with your health care provider or diet and nutrition specialist (dietitian) to adjust your eating plan to your individual calorie needs. Reading food labels   Check food labels for the amount of sodium per serving. Choose foods with less than 5 percent of the Daily Value of sodium. Generally, foods with less than 300 mg of sodium per serving fit into this eating plan.  To find whole grains, look for the word "whole" as the first word in the ingredient list. Shopping  Buy products labeled as "low-sodium" or "no salt added."  Buy fresh foods. Avoid canned foods and premade or frozen meals. Cooking  Avoid adding salt when cooking. Use salt-free seasonings or herbs instead of table salt or sea salt. Check with your health care provider or pharmacist before using salt substitutes.  Do not fry foods. Cook foods using healthy methods such as baking, boiling, grilling, and broiling instead.  Cook with heart-healthy oils, such as olive, canola, soybean, or sunflower oil. Meal planning  Eat a balanced diet that includes: ? 5 or more servings of fruits and vegetables each day. At each meal, try to fill half of your plate with fruits and vegetables. ? Up to 6-8 servings of whole grains each day. ? Less than 6 oz of lean meat, poultry, or fish each day. A 3-oz serving of meat is about the same size as a deck of cards. One egg  equals 1 oz. ? 2 servings of low-fat dairy each day. ? A serving of nuts, seeds, or beans 5 times each week. ? Heart-healthy fats. Healthy fats called Omega-3 fatty acids are found in foods such as flaxseeds and coldwater fish, like sardines, salmon, and mackerel.  Limit how much you eat of the following: ? Canned or prepackaged foods. ? Food that is high in trans fat, such as fried foods. ? Food that is high in saturated fat, such as fatty meat. ? Sweets, desserts, sugary drinks, and other foods with added sugar. ? Full-fat dairy products.  Do not salt foods before eating.  Try to eat at least 2 vegetarian meals each week.  Eat more home-cooked food and less restaurant, buffet, and fast food.  When eating at a restaurant, ask that your food be prepared with less salt or no salt, if possible. What foods are recommended? The items listed may not be a complete list. Talk with your dietitian about what dietary choices are best for you. Grains Whole-grain or whole-wheat bread. Whole-grain or whole-wheat pasta. Brown rice. Orpah Cobb. Bulgur. Whole-grain and low-sodium cereals. Pita bread. Low-fat, low-sodium crackers. Whole-wheat flour tortillas. Vegetables Fresh or frozen vegetables (raw, steamed, roasted, or grilled). Low-sodium or reduced-sodium tomato and vegetable juice. Low-sodium or reduced-sodium tomato sauce and tomato paste. Low-sodium or reduced-sodium canned vegetables. Fruits All fresh, dried, or frozen fruit. Canned fruit in natural juice (without added sugar). Meat and other protein foods Skinless chicken or [place]. Ground chicken or [place]. Pork with fat trimmed off. Fish and seafood. Egg whites. Dried beans, peas, or lentils. Unsalted nuts, nut butters, and seeds. Unsalted canned beans. Lean cuts of beef with fat trimmed off. Low-sodium, lean deli meat. Dairy Low-fat (1%) or fat-free (skim) milk. Fat-free, low-fat, or reduced-fat cheeses.  Nonfat, low-sodium ricotta or  cottage cheese. Low-fat or nonfat yogurt. Low-fat, low-sodium cheese. Fats and oils Soft margarine without trans fats. Vegetable oil. Low-fat, reduced-fat, or light mayonnaise and salad dressings (reduced-sodium). Canola, safflower, olive, soybean, and sunflower oils. Avocado. Seasoning and other foods Herbs. Spices. Seasoning mixes without salt. Unsalted popcorn and pretzels. Fat-free sweets. What foods are not recommended? The items listed may not be a complete list. Talk with your dietitian about what dietary choices are best for you. Grains Baked goods made with fat, such as croissants, muffins, or some breads. Dry pasta or rice meal packs. Vegetables Creamed or fried vegetables. Vegetables in a cheese sauce. Regular canned vegetables (not low-sodium or reduced-sodium). Regular canned tomato sauce and paste (not low-sodium or reduced-sodium). Regular tomato and vegetable juice (not low-sodium or reduced-sodium). Rosita FirePickles. Olives. Fruits Canned fruit in a light or heavy syrup. Fried fruit. Fruit in cream or butter sauce. Meat and other protein foods Fatty cuts of meat. Ribs. Fried meat. Tomasa BlaseBacon. Sausage. Bologna and other processed lunch meats. Salami. Fatback. Hotdogs. Bratwurst. Salted nuts and seeds. Canned beans with added salt. Canned or smoked fish. Whole eggs or egg yolks. Chicken or [place] with skin. Dairy Whole or 2% milk, cream, and half-and-half. Whole or full-fat cream cheese. Whole-fat or sweetened yogurt. Full-fat cheese. Nondairy creamers. Whipped toppings. Processed cheese and cheese spreads. Fats and oils Butter. Stick margarine. Lard. Shortening. Ghee. Bacon fat. Tropical oils, such as coconut, palm kernel, or palm oil. Seasoning and other foods Salted popcorn and pretzels. Onion salt, garlic salt, seasoned salt, table salt, and sea salt. Worcestershire sauce. Tartar sauce. Barbecue sauce. Teriyaki sauce. Soy sauce, including reduced-sodium. Steak sauce. Canned and packaged  gravies. Fish sauce. Oyster sauce. Cocktail sauce. Horseradish that you find on the shelf. Ketchup. Mustard. Meat flavorings and tenderizers. Bouillon cubes. Hot sauce and Tabasco sauce. Premade or packaged marinades. Premade or packaged taco seasonings. Relishes. Regular salad dressings. Where to find more information:  National Heart, Lung, and Blood Institute: PopSteam.iswww.nhlbi.nih.gov  American Heart Association: www.heart.org Summary  The DASH eating plan is a healthy eating plan that has been shown to reduce high blood pressure (hypertension). It may also reduce your risk for type 2 diabetes, heart disease, and stroke.  With the DASH eating plan, you should limit salt (sodium) intake to 2,300 mg a day. If you have hypertension, you may need to reduce your sodium intake to 1,500 mg a day.  When on the DASH eating plan, aim to eat more fresh fruits and vegetables, whole grains, lean proteins, low-fat dairy, and heart-healthy fats.  Work with your health care provider or diet and nutrition specialist (dietitian) to adjust your eating plan to your individual calorie needs. This information is not intended to replace advice given to you by your health care provider. Make sure you discuss any questions you have with your health care provider. Document Released: 11/22/2011 Document Revised: 11/15/2017 Document Reviewed: 11/26/2016 Elsevier Patient Education  2020 [employer]  Managing Your Hypertension Hypertension is commonly called high blood pressure. This is when the force of your blood pressing against the walls of your arteries is too strong. Arteries are blood vessels that carry blood from your heart throughout your body. Hypertension forces the heart to work harder to pump blood, and may cause the arteries to become narrow or stiff. Having untreated or uncontrolled hypertension can cause heart attack, stroke, kidney disease, and other problems. What are blood pressure readings? A blood  pressure reading consists of a higher  number over a lower number. Ideally, your blood pressure should be below 120/80. The first ("top") number is called the systolic pressure. It is a measure of the pressure in your arteries as your heart beats. The second ("bottom") number is called the diastolic pressure. It is a measure of the pressure in your arteries as the heart relaxes. What does my blood pressure reading mean? Blood pressure is classified into four stages. Based on your blood pressure reading, your health care provider may use the following stages to determine what type of treatment you need, if any. Systolic pressure and diastolic pressure are measured in a unit called mm Hg. Normal  Systolic pressure: below 120.  Diastolic pressure: below 80. Elevated  Systolic pressure: 120-129.  Diastolic pressure: below 80. Hypertension stage 1  Systolic pressure: 130-139.  Diastolic pressure: 80-89. Hypertension stage 2  Systolic pressure: 140 or above.  Diastolic pressure: 90 or above. What health risks are associated with hypertension? Managing your hypertension is an important responsibility. Uncontrolled hypertension can lead to:  A heart attack.  A stroke.  A weakened blood vessel (aneurysm).  Heart failure.  Kidney damage.  Eye damage.  Metabolic syndrome.  Memory and concentration problems. What changes can I make to manage my hypertension? Hypertension can be managed by making lifestyle changes and possibly by taking medicines. Your health care provider will help you make a plan to bring your blood pressure within a normal range. Eating and drinking   Eat a diet that is high in fiber and potassium, and low in salt (sodium), added sugar, and fat. An example eating plan is called the DASH (Dietary Approaches to Stop Hypertension) diet. To eat this way: ? Eat plenty of fresh fruits and vegetables. Try to fill half of your plate at each meal with fruits and  vegetables. ? Eat whole grains, such as whole wheat pasta, brown rice, or whole grain bread. Fill about one quarter of your plate with whole grains. ? Eat low-fat diary products. ? Avoid fatty cuts of meat, processed or cured meats, and poultry with skin. Fill about one quarter of your plate with lean proteins such as fish, chicken without skin, beans, eggs, and tofu. ? Avoid premade and processed foods. These tend to be higher in sodium, added sugar, and fat.  Reduce your daily sodium intake. Most people with hypertension should eat less than 1,500 mg of sodium a day.  Limit alcohol intake to no more than 1 drink a day for nonpregnant women and 2 drinks a day for men. One drink equals 12 oz of beer, 5 oz of wine, or 1 oz of hard liquor. Lifestyle  Work with your health care provider to maintain a healthy body weight, or to lose weight. Ask what an ideal weight is for you.  Get at least 30 minutes of exercise that causes your heart to beat faster (aerobic exercise) most days of the week. Activities may include walking, swimming, or biking.  Include exercise to strengthen your muscles (resistance exercise), such as weight lifting, as part of your weekly exercise routine. Try to do these types of exercises for 30 minutes at least 3 days a week.  Do not use any products that contain nicotine or tobacco, such as cigarettes and e-cigarettes. If you need help quitting, ask your health care provider.  Control any long-term (chronic) conditions you have, such as high cholesterol or diabetes. Monitoring  Monitor your blood pressure at home as told by your health care provider. Your  personal target blood pressure may vary depending on your medical conditions, your age, and other factors.  Have your blood pressure checked regularly, as often as told by your health care provider. Working with your health care provider  Review all the medicines you take with your health care provider because there may  be side effects or interactions.  Talk with your health care provider about your diet, exercise habits, and other lifestyle factors that may be contributing to hypertension.  Visit your health care provider regularly. Your health care provider can help you create and adjust your plan for managing hypertension. Will I need medicine to control my blood pressure? Your health care provider may prescribe medicine if lifestyle changes are not enough to get your blood pressure under control, and if:  Your systolic blood pressure is 130 or higher.  Your diastolic blood pressure is 80 or higher. Take medicines only as told by your health care provider. Follow the directions carefully. Blood pressure medicines must be taken as prescribed. The medicine does not work as well when you skip doses. Skipping doses also puts you at risk for problems. Contact a health care provider if:  You think you are having a reaction to medicines you have taken.  You have repeated (recurrent) headaches.  You feel dizzy.  You have swelling in your ankles.  You have trouble with your vision. Get help right away if:  You develop a severe headache or confusion.  You have unusual weakness or numbness, or you feel faint.  You have severe pain in your chest or abdomen.  You vomit repeatedly.  You have trouble breathing. Summary  Hypertension is when the force of blood pumping through your arteries is too strong. If this condition is not controlled, it may put you at risk for serious complications.  Your personal target blood pressure may vary depending on your medical conditions, your age, and other factors. For most people, a normal blood pressure is less than 120/80.  Hypertension is managed by lifestyle changes, medicines, or both. Lifestyle changes include weight loss, eating a healthy, low-sodium diet, exercising more, and limiting alcohol. This information is not intended to replace advice given to you by  your health care provider. Make sure you discuss any questions you have with your health care provider. Document Released: 08/27/2012 Document Revised: 03/27/2019 Document Reviewed: 10/31/2016 Elsevier Patient Education  2020 [employer].

## 2019-10-21 NOTE — Progress Notes (Signed)
Subjective:    Patient ID: Billy Scott, male    DOB: 01-14-1956, 63 y.o.   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  Ran out of his Janumet XR Sunday 4 days ago had problems with [facility] not sending request to [facility] provider.   Patient reported he had stopped eating prepackaged foods and increasing fresh fruits and vegetables.   [place] and [place].  Last DOT 2 years ago with Dr Despina Arias.  See FMCSA 5875  Last optometry appt greater than 2 years ago.  Wears readers only for close up reading.  Last appt with Dr Dorris Fetch 06/29/2019.  Labs Jan and Apr 2020 exec panel and Jul 2020 Hgba1c.  Had colonoscopy Feb 05, 2018 internal hemorrhoids and to follow up in 10 years.  Last kidney stone requiring ED/hospitalization/stent/surgery Nov 08, 2015 Dr Berneice Heinrich,       Review of Systems  Constitutional: Negative for activity change, appetite change, chills, diaphoresis, fatigue, fever and unexpected weight change.  HENT: Negative for facial swelling, trouble swallowing and voice change.   Eyes: Negative for photophobia, pain, discharge, redness, itching and visual disturbance.  Respiratory: Negative for cough, choking, shortness of breath, wheezing and stridor.   Cardiovascular: Negative for chest pain, palpitations and leg swelling.  Gastrointestinal: Negative for abdominal distention, abdominal pain, anal bleeding, blood in stool, diarrhea, nausea and vomiting.  Endocrine: Negative for cold intolerance and heat intolerance.  Genitourinary: Negative for difficulty urinating.  Musculoskeletal: Positive for arthralgias. Negative for back pain, gait problem, joint swelling, myalgias, neck pain and neck stiffness.  Skin: Negative for color change, pallor, rash and wound.  Allergic/Immunologic: Negative for food  allergies.  Neurological: Negative for dizziness, tremors, seizures, syncope, facial asymmetry, speech difficulty, weakness, light-headedness, numbness and headaches.  Hematological: Negative for adenopathy. Does not bruise/bleed easily.  Psychiatric/Behavioral: Negative for agitation, confusion and sleep disturbance.       Objective:   Physical Exam Vitals signs and nursing note reviewed.  Constitutional:      General: He is awake. He is not in acute distress.    Appearance: Normal appearance. He is well-developed, well-groomed and overweight. He is not ill-appearing, toxic-appearing or diaphoretic.  HENT:     Head: Normocephalic and atraumatic.     Jaw: There is normal jaw occlusion. No trismus.     Salivary Glands: Right salivary gland is not diffusely enlarged or tender. Left salivary gland is not diffusely enlarged or tender.     Right Ear: Hearing, ear canal and external ear normal. A middle ear effusion is present. There is no impacted cerumen.     Left Ear: Hearing, ear canal and external ear normal. A middle ear effusion is present. There is no impacted cerumen.     Nose: Nose normal. No nasal deformity, septal deviation, signs of injury, laceration, nasal tenderness, mucosal edema, congestion or rhinorrhea.     Right Sinus: No maxillary sinus tenderness or frontal sinus tenderness.     Left Sinus: No maxillary sinus tenderness or frontal sinus tenderness.     Mouth/Throat:     Lips: Pink. No lesions.     Mouth: Mucous membranes are moist. Mucous membranes are not pale, not dry and not cyanotic. No injury, lacerations, oral lesions or angioedema.     Dentition: Normal dentition. Does not have dentures. No gingival  swelling, dental caries, dental abscesses or gum lesions.     Tongue: No lesions. Tongue does not deviate from midline.     Palate: No mass and lesions.     Pharynx: Oropharynx is clear. Uvula midline. No pharyngeal swelling, oropharyngeal exudate, posterior  oropharyngeal erythema or uvula swelling.     Tonsils: No tonsillar exudate or tonsillar abscesses. 0 on the right. 0 on the left.  Eyes:     General: Lids are normal. Vision grossly intact. Gaze aligned appropriately. No allergic shiner, visual field deficit or scleral icterus.       Right eye: No foreign body, discharge or hordeolum.        Left eye: No foreign body, discharge or hordeolum.     Extraocular Movements: Extraocular movements intact.     Right eye: Normal extraocular motion and no nystagmus.     Left eye: Normal extraocular motion and no nystagmus.     Conjunctiva/sclera: Conjunctivae normal.     Right eye: Right conjunctiva is not injected. No chemosis, exudate or hemorrhage.    Left eye: Left conjunctiva is not injected. No chemosis, exudate or hemorrhage.    Pupils: Pupils are equal, round, and reactive to light. Pupils are equal.     Right eye: Pupil is round and reactive.     Left eye: Pupil is round and reactive.  Neck:     Musculoskeletal: Normal range of motion and neck supple. Normal range of motion. No edema, erythema, neck rigidity, crepitus, injury, pain with movement, torticollis, spinous process tenderness or muscular tenderness.     Thyroid: No thyroid mass, thyromegaly or thyroid tenderness.     Trachea: Trachea and phonation normal. No tracheal tenderness or tracheal deviation.  Cardiovascular:     Rate and Rhythm: Normal rate and regular rhythm.     Chest Wall: PMI is not displaced.     Pulses:          Radial pulses are 2+ on the right side and 2+ on the left side.     Heart sounds: Normal heart sounds, S1 normal and S2 normal. No murmur. No friction rub. No gallop.   Pulmonary:     Effort: Pulmonary effort is normal. No respiratory distress.     Breath sounds: Normal breath sounds and air entry. No stridor, decreased air movement or transmitted upper airway sounds. No decreased breath sounds, wheezing, rhonchi or rales.     Comments: No cough observed in  exam room; wearing cloth buff due to covid 19 pandemic as face mask; spoke full sentences without difficulty Abdominal:     General: Abdomen is flat. Bowel sounds are normal. There is no distension.     Palpations: Abdomen is soft. There is no shifting dullness, fluid wave, hepatomegaly, splenomegaly, mass or pulsatile mass.     Tenderness: There is no abdominal tenderness. There is no right CVA tenderness, left CVA tenderness, guarding or rebound. Negative signs include Murphy's sign.     Hernia: A hernia is present. Hernia is present in the ventral area. There is no hernia in the umbilical area, left inguinal area, right femoral area, left femoral area or right inguinal area.     Comments: PA Anette Riedel chaperoned inguinal hernia exam only; with abdominal crunch vental hernia 6x50midline upper abdomen bulge with exertion resolves spontaneously in supine position and none noted with standing; nontender; dull to percussion x 4 quads; normoactive bowel sounds x 4 quads  Musculoskeletal: Normal range of motion.  General: No swelling, tenderness, deformity or signs of injury.     Right shoulder: Normal.     Left shoulder: Normal.     Right elbow: Normal.    Left elbow: Normal.     Right hip: Normal.     Left hip: Normal.     Right knee: Normal.     Left knee: Normal.     Right ankle: Normal.     Left ankle: Normal.     Cervical back: Normal.     Thoracic back: Normal.     Lumbar back: Normal.     Right hand: Normal.     Left hand: Normal.     Right lower leg: No edema.     Left lower leg: No edema.     Comments: Full AROM all major joints without pain; able to touch floor with lumbar flexion and knees straight; negative NEERS/atchley scratch/internal/external rotation shoulder  Lymphadenopathy:     Head:     Right side of head: No submental, submandibular, tonsillar, preauricular, posterior auricular or occipital adenopathy.     Left side of head: No submental, submandibular,  tonsillar, preauricular, posterior auricular or occipital adenopathy.     Cervical: No cervical adenopathy.     Right cervical: No superficial, deep or posterior cervical adenopathy.    Left cervical: No superficial, deep or posterior cervical adenopathy.  Skin:    General: Skin is warm and dry.     Capillary Refill: Capillary refill takes less than 2 seconds.     Coloration: Skin is not ashen, cyanotic, jaundiced, mottled, pale or sallow.     Findings: No abrasion, abscess, acne, bruising, burn, ecchymosis, erythema, signs of injury, laceration, lesion, petechiae, rash or wound.     Nails: There is no clubbing.           Comments: Multiple healed scars see FMCSA 5875  Neurological:     General: No focal deficit present.     Mental Status: He is alert and oriented to person, place, and time. Mental status is at baseline.     GCS: GCS eye subscore is 4. GCS verbal subscore is 5. GCS motor subscore is 6.     Cranial Nerves: Cranial nerves are intact. No cranial nerve deficit, dysarthria or facial asymmetry.     Sensory: Sensation is intact. No sensory deficit.     Motor: Motor function is intact. No weakness, tremor, atrophy, abnormal muscle tone or seizure activity.     Coordination: Coordination is intact. Coordination normal.     Gait: Gait is intact. Gait normal.     Deep Tendon Reflexes: Reflexes normal.     Reflex Scores:      Patellar reflexes are 2+ on the right side and 2+ on the left side.    Comments: On/off exam table without difficulty; gait sure and steady in hallway; in/out of chair without difficulty; bilateral hand grasp equal 5/5  Psychiatric:        Attention and Perception: Attention and perception normal.        Mood and Affect: Mood and affect normal.        Speech: Speech normal.        Behavior: Behavior normal. Behavior is cooperative.        Thought Content: Thought content normal.        Cognition and Memory: Cognition and memory normal.        Judgment:  Judgment normal.  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  Due to overweight, dyslipidemia, hypertension and type II diabetes only 1 year certificate given.   Patient to follow up in 1 year.  Sooner if new medical problems/hospitalization/ER visit.  Patient instructed to send in copy FMCSA 5876 per [place] DOT directives.  Patient verbalized understanding information/instructions, agreed with plan of care and had no further questions at this time.  Refilled Janumet XR 50/1000 take two tabs daily #180 RF0 electronic Rx to his pharmacy of choice.  Hgba1C 8.0 today improved from 3 months ago.  Continue diet and exercise modification encouraged weight loss to BMI less than 25.  Avoid added sugars in diet keep to less than 9 teaspoons/150 calories per day.  Dietary fiber recommended 30 grams per day and 150 minutes exercise per week.  Schedule routine optometry appt overdue for annual. I recommend influenza [facility] with RN Paschal Doppobbins.  Patient verbalized understanding information/instructions, agreed with plan of care and had no further questions at this time.  Continue medication as prescribed ziac 5/6.25mg  po daily.  I recommend dash/mediterranean diet.  BP well controlled today. Continue working on weight loss and exercise 150 minutes weekly.  Goal to BMI less than 25.  Patient verbalized understanding information/instructions, agreed with plan of care and had no further questions at this time.  Continue atorvastatin 20mg  po daily  High fiber diet 30 gms per day.  Consider DASH/mediterranean diet to help medication with  dyslipidemia  Patient verbalized understanding information/instructions, agreed with plan of care and had no further questions at this time.

## 2019-10-21 NOTE — Telephone Encounter (Signed)
See office visit note 10/21/2019 HgbA1c much improved.  Refilled janumet XR for patient electronic Rx to his pharmacy of choice.  Patient notified verbally.  Follow up 3 months for next HgbA1c.

## 2019-12-29 ENCOUNTER — Other Ambulatory Visit: Payer: Self-pay

## 2019-12-29 ENCOUNTER — Ambulatory Visit: Payer: 59

## 2019-12-29 DIAGNOSIS — Z0000 Encounter for general adult medical examination without abnormal findings: Secondary | ICD-10-CM

## 2019-12-29 LAB — POCT URINALYSIS DIPSTICK
Bilirubin, UA: NEGATIVE
Blood, UA: NEGATIVE
Glucose, UA: POSITIVE — AB
Ketones, UA: NEGATIVE
Leukocytes, UA: NEGATIVE
Nitrite, UA: NEGATIVE
Protein, UA: NEGATIVE
Spec Grav, UA: 1.02 (ref 1.010–1.025)
Urobilinogen, UA: 0.2 E.U./dL
pH, UA: 5.5 (ref 5.0–8.0)

## 2019-12-29 NOTE — Progress Notes (Signed)
Scheduled to complete physical 01/05/2020 with Lucia Gaskins, MD (Interim Provider).  AMD

## 2019-12-31 LAB — MICROALBUMIN / CREATININE URINE RATIO
Creatinine, Urine: 65.4 mg/dL
Microalb/Creat Ratio: 8 mg/g creat (ref 0–29)
Microalbumin, Urine: 5.4 ug/mL

## 2019-12-31 LAB — CMP12+LP+TP+TSH+6AC+PSA+CBC…
ALT: 29 IU/L (ref 0–44)
AST: 18 IU/L (ref 0–40)
Albumin/Globulin Ratio: 1.8 (ref 1.2–2.2)
Albumin: 4.5 g/dL (ref 3.8–4.8)
Alkaline Phosphatase: 102 IU/L (ref 39–117)
BUN/Creatinine Ratio: 16 (ref 10–24)
BUN: 16 mg/dL (ref 8–27)
Basophils Absolute: 0.1 10*3/uL (ref 0.0–0.2)
Basos: 1 %
Bilirubin Total: 1.1 mg/dL (ref 0.0–1.2)
Calcium: 9.5 mg/dL (ref 8.6–10.2)
Chloride: 99 mmol/L (ref 96–106)
Chol/HDL Ratio: 4.3 ratio (ref 0.0–5.0)
Cholesterol, Total: 151 mg/dL (ref 100–199)
Creatinine, Ser: 0.98 mg/dL (ref 0.76–1.27)
EOS (ABSOLUTE): 0.2 10*3/uL (ref 0.0–0.4)
Eos: 2 %
Estimated CHD Risk: 0.8 times avg. (ref 0.0–1.0)
Free Thyroxine Index: 1.5 (ref 1.2–4.9)
GFR calc Af Amer: 94 mL/min/{1.73_m2} (ref >59)
GFR calc non Af Amer: 81 mL/min/{1.73_m2} (ref >59)
GGT: 15 IU/L (ref 0–65)
Globulin, Total: 2.5 g/dL (ref 1.5–4.5)
Glucose: 236 mg/dL — ABNORMAL HIGH (ref 65–99)
HDL: 35 mg/dL — ABNORMAL LOW (ref >39)
Hematocrit: 46.9 % (ref 37.5–51.0)
Hemoglobin: 16.2 g/dL (ref 13.0–17.7)
Immature Grans (Abs): 0.1 10*3/uL (ref 0.0–0.1)
Immature Granulocytes: 1 %
Iron: 90 ug/dL (ref 38–169)
LDH: 210 IU/L (ref 121–224)
LDL Chol Calc (NIH): 78 mg/dL (ref 0–99)
Lymphocytes Absolute: 2.3 10*3/uL (ref 0.7–3.1)
Lymphs: 23 %
MCH: 28.6 pg (ref 26.6–33.0)
MCHC: 34.5 g/dL (ref 31.5–35.7)
MCV: 83 fL (ref 79–97)
Monocytes Absolute: 0.8 10*3/uL (ref 0.1–0.9)
Monocytes: 8 %
Neutrophils Absolute: 6.6 10*3/uL (ref 1.4–7.0)
Neutrophils: 65 %
Phosphorus: 4.6 mg/dL — ABNORMAL HIGH (ref 2.8–4.1)
Platelets: 210 10*3/uL (ref 150–450)
Potassium: 4.8 mmol/L (ref 3.5–5.2)
Prostate Specific Ag, Serum: 0.6 ng/mL (ref 0.0–4.0)
RBC: 5.66 x10E6/uL (ref 4.14–5.80)
RDW: 12.9 % (ref 11.6–15.4)
Sodium: 138 mmol/L (ref 134–144)
T3 Uptake Ratio: 23 % — ABNORMAL LOW (ref 24–39)
T4, Total: 6.6 ug/dL (ref 4.5–12.0)
TSH: 2.79 u[IU]/mL (ref 0.450–4.500)
Total Protein: 7 g/dL (ref 6.0–8.5)
Triglycerides: 229 mg/dL — ABNORMAL HIGH (ref 0–149)
Uric Acid: 5.1 mg/dL (ref 3.8–8.4)
VLDL Cholesterol Cal: 38 mg/dL (ref 5–40)
WBC: 10.1 10*3/uL (ref 3.4–10.8)

## 2019-12-31 LAB — HGB A1C W/O EAG: Hgb A1c MFr Bld: 9.3 % — ABNORMAL HIGH (ref 4.8–5.6)

## 2020-01-05 ENCOUNTER — Ambulatory Visit: Payer: 59 | Admitting: Occupational Medicine

## 2020-01-05 ENCOUNTER — Other Ambulatory Visit: Payer: Self-pay

## 2020-01-05 VITALS — BP 128/86 | HR 69 | Temp 97.9°F | Ht 70.0 in | Wt 214.4 lb

## 2020-01-05 DIAGNOSIS — Z0000 Encounter for general adult medical examination without abnormal findings: Secondary | ICD-10-CM

## 2020-01-05 NOTE — Progress Notes (Signed)
  Subjective:     Patient ID: Billy Scott, male   DOB: 1956-01-20, 64 y.o.   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  Current medical conditions include diabetes, hypertension, dyslipidemia, history of kidney stones, remote history of benign lung tumor resection.  He is doing quite well.  Since he last saw [place] in November he has gained about 5 pounds and says his diet has not been optimal.  He is dedicated to getting his diet back on track.  Labs from a couple of days ago showed glucose of 236 and A1c of 9.3.  Back in November it was 8.0 and 9 months ago was over 12.  He is feeling well with no symptoms today or complaints.  He is up-to-date on his colonoscopy.  He does need to follow-up with ophthalmology for annual eye exam.  No other complaints today.   Review of Systems     Objective:   Physical Exam Today's Vitals   01/05/20 1102  BP: 128/86  Pulse: 69  Temp: 97.9 F (36.6 C)  TempSrc: Oral  SpO2: 98%  Weight: 214 lb 6.4 oz (97.3 kg)  Height: 5\' 10"  (1.778 m)  PainSc: 0-No pain   Body mass index is 30.76 kg/m.    Constitutional: Pleasant affect.  Cooperative.  No distress.  HEENT: NCAT.  PERRL.  Cardiovascular exam is normal.  Lung exam no wheezing or crackles.  L.  Large scar noted posteriorly from lobectomy in the remote past.  Abdomen: Benign exam.  Normal strength reflexes and sensory in upper and lower extremities.  Diabetic foot exam was unremarkable.  Assessment:     Diabetes-hemoglobin A1c is up due to poor diet over the holidays. Kidney stones-no recent problems Hypertension-controlled Dyslipidemia-controlled with meds    Plan:     He will improve his diet and follow-up with in 3 months with repeat fasting glucose and A1c.  He will also contact ophthalmologist for annual exam.  Up-to-date with colonoscopy.

## 2020-01-15 ENCOUNTER — Other Ambulatory Visit: Payer: Self-pay

## 2020-01-15 DIAGNOSIS — E119 Type 2 diabetes mellitus without complications: Secondary | ICD-10-CM

## 2020-01-18 ENCOUNTER — Other Ambulatory Visit: Payer: Self-pay | Admitting: Physician Assistant

## 2020-01-18 ENCOUNTER — Telehealth: Payer: Self-pay

## 2020-01-18 DIAGNOSIS — E119 Type 2 diabetes mellitus without complications: Secondary | ICD-10-CM

## 2020-01-18 MED ORDER — JANUMET 50-1000 MG PO TABS: 2.0000 | ORAL_TABLET | Freq: Two times a day (BID) | ORAL | 3 refills | Status: AC

## 2020-01-18 NOTE — Telephone Encounter (Signed)
Contacted [facility] to clarify how he takes Janumet. States he takes 2 tablets every morning.  AMD

## 2020-01-21 ENCOUNTER — Other Ambulatory Visit: Payer: Self-pay

## 2020-01-21 ENCOUNTER — Other Ambulatory Visit: Payer: Self-pay | Admitting: Physician Assistant

## 2020-01-21 DIAGNOSIS — E7849 Other hyperlipidemia: Secondary | ICD-10-CM

## 2020-01-21 MED ORDER — ATORVASTATIN CALCIUM 20 MG PO TABS: 20.0000 mg | ORAL_TABLET | Freq: Every day | ORAL | 2 refills | Status: DC

## 2020-01-21 NOTE — Progress Notes (Signed)
Annual exam with Dr Dorris Fetch 12/2018 Continue RX, well tolerated Covid restrictions have  limited follow up office visits

## 2020-04-05 ENCOUNTER — Other Ambulatory Visit: Payer: Self-pay

## 2020-04-05 ENCOUNTER — Encounter: Payer: Self-pay | Admitting: Emergency Medicine

## 2020-04-05 ENCOUNTER — Ambulatory Visit: Payer: Self-pay | Admitting: Emergency Medicine

## 2020-04-05 VITALS — BP 121/86 | HR 52 | Temp 97.8°F | Resp 16 | Ht 70.5 in | Wt 213.0 lb

## 2020-04-05 DIAGNOSIS — E1165 Type 2 diabetes mellitus with hyperglycemia: Secondary | ICD-10-CM

## 2020-04-05 DIAGNOSIS — E119 Type 2 diabetes mellitus without complications: Secondary | ICD-10-CM

## 2020-04-05 LAB — POCT GLYCOSYLATED HEMOGLOBIN (HGB A1C): Hemoglobin A1C: 9.2 % — AB (ref 4.0–5.6)

## 2020-04-05 NOTE — Progress Notes (Signed)
S:  Here for A1c follow up for 3 months.  Patient admits that he has not being eating or drinking like he should. Yesterday ate 2 bananas and one apple.  He states he is driving long distance from [place] to [place] and often cannot find anything but fast food that is open due to Covid.  He also has been drinking regular sodas and half-and-half ice tea.  He admits that he has been completely off track and making bad choices.  He also does not exercise but got a bicycle for Christmas and has not rode on it once.  O: A1c today is again 9.2 with the last A1c being 9.3.    A: DIABETES mellitus uncontrolled.  Noncompliant.  P: Patient agrees and understands that a better diet is needed on his part and consistency.  He also commits to riding his bicycle since it is now April and good weather.  He would discontinue drinking half-and-half ice tea.  He admits that he has not been doing the correct things.  We also discussed a referral to the lifestyle Center to go over foods that he actually likes.  A1c, BUN and creatinine are due in 3 months.  At that time we will discuss either increasing his medication or considering insulin which he does not want to do and is his motivation for getting back on track.

## 2020-04-05 NOTE — Progress Notes (Signed)
Labs completed 12/29/19 for annual physical 01/18/20 with Dr. Lucia Gaskins.  Presents today for 3 month DM follow-up with A1c Now.  AMD

## 2020-04-26 ENCOUNTER — Other Ambulatory Visit: Payer: Self-pay

## 2020-04-26 MED ORDER — ATORVASTATIN CALCIUM 20 MG PO TABS: 20.0000 mg | ORAL_TABLET | Freq: Every day | ORAL | 2 refills | Status: DC

## 2020-05-27 ENCOUNTER — Other Ambulatory Visit: Payer: Self-pay

## 2020-05-27 DIAGNOSIS — I10 Essential (primary) hypertension: Secondary | ICD-10-CM

## 2020-05-27 DIAGNOSIS — E119 Type 2 diabetes mellitus without complications: Secondary | ICD-10-CM

## 2020-05-27 MED ORDER — BISOPROLOL-HYDROCHLOROTHIAZIDE 5-6.25 MG PO TABS: 1.0000 | ORAL_TABLET | Freq: Every day | ORAL | 2 refills | Status: AC

## 2020-05-27 MED ORDER — JARDIANCE 10 MG PO TABS: 10.0000 mg | ORAL_TABLET | Freq: Every morning | ORAL | 2 refills | Status: DC

## 2020-06-24 NOTE — Progress Notes (Signed)
Scheduled to review lab results 07/12/20 with Dr. Mayford Knife.  AMD

## 2020-06-28 ENCOUNTER — Other Ambulatory Visit: Payer: Self-pay

## 2020-06-28 DIAGNOSIS — E119 Type 2 diabetes mellitus without complications: Secondary | ICD-10-CM

## 2020-06-29 LAB — BUN+CREAT
BUN/Creatinine Ratio: 16 (ref 10–24)
BUN: 20 mg/dL (ref 8–27)
Creatinine, Ser: 1.27 mg/dL (ref 0.76–1.27)
GFR calc Af Amer: 69 mL/min/{1.73_m2} (ref >59)
GFR calc non Af Amer: 59 mL/min/{1.73_m2} — ABNORMAL LOW (ref >59)

## 2020-06-29 LAB — HGB A1C W/O EAG: Hgb A1c MFr Bld: 10 % — ABNORMAL HIGH (ref 4.8–5.6)

## 2020-07-12 ENCOUNTER — Other Ambulatory Visit: Payer: Self-pay

## 2020-07-12 ENCOUNTER — Ambulatory Visit: Payer: Self-pay | Admitting: Emergency Medicine

## 2020-07-12 VITALS — BP 126/82 | HR 64 | Temp 98.7°F | Resp 14 | Ht 70.0 in | Wt 210.0 lb

## 2020-07-12 DIAGNOSIS — E119 Type 2 diabetes mellitus without complications: Secondary | ICD-10-CM

## 2020-07-13 NOTE — Progress Notes (Unsigned)
Presents for 6 month DM follow-up to review lab results.  AMD

## 2020-07-19 ENCOUNTER — Other Ambulatory Visit: Payer: Self-pay | Admitting: Physician Assistant

## 2020-07-19 DIAGNOSIS — E119 Type 2 diabetes mellitus without complications: Secondary | ICD-10-CM

## 2020-10-31 ENCOUNTER — Other Ambulatory Visit: Payer: Self-pay

## 2020-10-31 ENCOUNTER — Encounter: Payer: Self-pay | Admitting: Family Medicine

## 2020-10-31 ENCOUNTER — Ambulatory Visit: Payer: BLUE CROSS/BLUE SHIELD | Admitting: Family Medicine

## 2020-10-31 ENCOUNTER — Ambulatory Visit: Payer: Self-pay

## 2020-10-31 VITALS — BP 121/77 | HR 71 | Temp 98.4°F | Ht 70.0 in | Wt 209.0 lb

## 2020-10-31 DIAGNOSIS — E119 Type 2 diabetes mellitus without complications: Secondary | ICD-10-CM

## 2020-10-31 DIAGNOSIS — Z024 Encounter for examination for driving license: Secondary | ICD-10-CM

## 2020-10-31 LAB — POCT GLYCOSYLATED HEMOGLOBIN (HGB A1C): Hemoglobin A1C: 7.5 % — AB (ref 4.0–5.6)

## 2020-10-31 NOTE — Assessment & Plan Note (Signed)
DOT Certificate provided x 1 year  Hearing test: Pass at 15' Vision: 20/40 R, 20/30 L, 20/40 Both Uncorrected Urine 1.015, Neg Protein, +250 Glucose, Neg Hematuria  A1C 7.5% STOPBANG: 3/8

## 2020-10-31 NOTE — Progress Notes (Signed)
Presents requesting POCT A1c test. States he's going to outside provider today for DOT Physical & wanted to take a more current A1c with him.  Last one was 4 months ago.  A1c Now = 7.5  AMD

## 2020-10-31 NOTE — Progress Notes (Signed)
Subjective:    Patient ID: Billy Scott, male    DOB: 03/10/1956, 64 y.o.   MRN: [id number]  Billy Scott is a 64 y.o. male presenting on 10/31/2020 for DOT physical   HPI  Billy Scott presents to clinic for DOT physical.  Depression screen [facility] 2/9 10/31/2020  Decreased Interest 0  Down, Depressed, Hopeless 0  PHQ - 2 Score 0  Altered sleeping 0  Tired, decreased energy 0  Change in appetite 0  Feeling bad or failure about yourself  0  Trouble concentrating 0  Moving slowly or fidgety/restless 0  Suicidal thoughts 0  PHQ-9 Score 0    Social History   Tobacco Use  . Smoking status: Never Smoker  . Smokeless tobacco: Never Used  Substance Use Topics  . Alcohol use: Yes    Alcohol/week: 1.0 standard drink    Types: 1 Standard drinks or equivalent per week  . Drug use: No    Review of Systems  Constitutional: Negative.   HENT: Negative.   Eyes: Negative.   Respiratory: Negative.   Cardiovascular: Negative.   Gastrointestinal: Negative.   Endocrine: Negative.   Genitourinary: Negative.   Musculoskeletal: Negative.   Skin: Negative.   Allergic/Immunologic: Negative.   Neurological: Negative.   Hematological: Negative.   Psychiatric/Behavioral: Negative.    Per HPI unless specifically indicated above     Objective:    BP 121/77   Pulse 71   Temp 98.4 F (36.9 C) (Oral)   Ht 5\' 10"  (1.778 m)   Wt 209 lb (94.8 kg)   SpO2 98%   BMI 29.99 kg/m   Wt Readings from Last 3 Encounters:  10/31/20 209 lb (94.8 kg)  07/13/20 (!) 210 lb (95.3 kg)  04/05/20 213 lb (96.6 kg)    Physical Exam Vitals reviewed.  Constitutional:      General: He is not in acute distress.    Appearance: Normal appearance. He is well-developed, well-groomed and overweight. He is not ill-appearing or toxic-appearing.  HENT:     Head: Normocephalic.     Right Ear: Tympanic membrane, ear canal and external ear normal. There is no impacted cerumen.     Left Ear: Tympanic membrane, ear  canal and external ear normal. There is no impacted cerumen.     Nose: Nose normal. No congestion or rhinorrhea.     Mouth/Throat:     Mouth: Mucous membranes are moist.     Pharynx: Oropharynx is clear. No oropharyngeal exudate or posterior oropharyngeal erythema.  Eyes:     General: Lids are normal. Vision grossly intact. No scleral icterus.       Right eye: No discharge.        Left eye: No discharge.     Extraocular Movements: Extraocular movements intact.     Conjunctiva/sclera: Conjunctivae normal.     Pupils: Pupils are equal, round, and reactive to light.  Cardiovascular:     Rate and Rhythm: Normal rate and regular rhythm.     Pulses: Normal pulses.          Dorsalis pedis pulses are 2+ on the right side and 2+ on the left side.     Heart sounds: Normal heart sounds. No murmur heard.  No friction rub. No gallop.   Pulmonary:     Effort: Pulmonary effort is normal. No respiratory distress.     Breath sounds: Normal breath sounds. No wheezing, rhonchi or rales.  Abdominal:     General: Abdomen is flat.  Bowel sounds are normal. There is no distension.     Palpations: Abdomen is soft. There is no hepatomegaly, splenomegaly or mass.     Tenderness: There is no abdominal tenderness. There is no right CVA tenderness, left CVA tenderness, guarding or rebound.     Hernia: No hernia is present.  Musculoskeletal:        General: Normal range of motion.     Cervical back: Normal range of motion and neck supple. No rigidity or tenderness.     Right lower leg: No edema.     Left lower leg: No edema.     Comments: Normal tone, 5/5 strength BUE & BLE  Feet:     Right foot:     Skin integrity: Skin integrity normal.     Left foot:     Skin integrity: Skin integrity normal.  Lymphadenopathy:     Cervical: No cervical adenopathy.  Skin:    General: Skin is warm and dry.     Capillary Refill: Capillary refill takes less than 2 seconds.  Neurological:     General: No focal deficit  present.     Mental Status: He is alert and oriented to person, place, and time.     Cranial Nerves: Cranial nerves are intact. No cranial nerve deficit.     Sensory: Sensation is intact. No sensory deficit.     Motor: Motor function is intact. No weakness.     Coordination: Coordination is intact. Coordination normal.     Gait: Gait is intact. Gait normal.     Deep Tendon Reflexes: Reflexes are normal and symmetric. Reflexes normal.  Psychiatric:        Attention and Perception: Attention and perception normal.        Mood and Affect: Mood and affect normal.        Speech: Speech normal.        Behavior: Behavior normal. Behavior is cooperative.        Thought Content: Thought content normal.        Cognition and Memory: Cognition and memory normal.        Judgment: Judgment normal.    Results for orders placed or performed in visit on 10/31/20  POCT glycosylated hemoglobin (Hb A1C)  Result Value Ref Range   Hemoglobin A1C 7.5 (A) 4.0 - 5.6 %   HbA1c POC (<> result, manual entry)     HbA1c, POC (prediabetic range)     HbA1c, POC (controlled diabetic range)        Assessment & Plan:   Problem List Items Addressed This Visit      Other   Encounter for Department of Transportation (DOT) examination for trucking license - Primary    DOT Certificate provided x 1 year  Hearing test: Pass at 15' Vision: 20/40 R, 20/30 L, 20/40 Both Uncorrected Urine 1.015, Neg Protein, +250 Glucose, Neg Hematuria  A1C 7.5% STOPBANG: 3/8          No orders of the defined types were placed in this encounter.  Follow up plan: Return in about 1 year (around 10/31/2021) for DOT PE.   Charlaine Dalton, FNP Family Nurse Practitioner [facility] 10/31/2020, 3:16 PM

## 2020-11-22 ENCOUNTER — Other Ambulatory Visit: Payer: Self-pay

## 2020-11-22 ENCOUNTER — Ambulatory Visit: Payer: Self-pay | Admitting: Physician Assistant

## 2020-11-22 ENCOUNTER — Encounter: Payer: Self-pay | Admitting: Physician Assistant

## 2020-11-22 VITALS — BP 120/70 | HR 51 | Resp 14

## 2020-11-22 DIAGNOSIS — I959 Hypotension, unspecified: Secondary | ICD-10-CM

## 2020-11-22 NOTE — Progress Notes (Signed)
   Subjective: Health assessment    Patient ID: Billy Scott, male    DOB: 01-05-1956, 64 y.o.   MRN: [id number]  HPI Patient presents for health assessment secondary to a nurse visit which noticed that he was bradycardic.  Patient voices no concerns.  Denies vertigo, syncope, or dyspnea.  Denies chest pain. Review of Systems    Diabetes, hyperlipidemia, and hypertension. Objective:   Physical Exam  HEENT is unremarkable.  Neck is supple for adenopathy or bruits.  Lungs clear to auscultation.  Patient heart is bradycardic.  Reviewed EKG taken today which shows no significant findings in comparison to previous EKGs dating back 5 years.     Assessment & Plan: Well exam.  Patient has a history of asymptomatic bradycardia well-documented on previous EKGs.  Patient reassurance and advised to continue previous medications.

## 2020-11-22 NOTE — Progress Notes (Signed)
Presents requesting an EKG.  States an NP from [facility] came to his house for a "Personal Health Recommendations & Screening" & was concerned because of low pulse rate & skipped beat heard when listening to heart. She recommended he have an EKG.  Billy Parcel, PA-C reviewed current EKG & past years & checked out Billy Scott.  Billy Scott turned 37 10/2020 & aged off [employer]. Plans to see Dr. Burnadette Pop at [facility]  Encouraged him to call to schedule an appt.  Informed him he can get his flu shot from out clinic - wants to wait until next week because he's going out of town this week.  Also, informed him that we will continue to refill his medications until he gets in with Dr. Burnadette Pop.  AMD

## 2021-01-23 ENCOUNTER — Other Ambulatory Visit: Payer: Self-pay | Admitting: Physician Assistant

## 2021-01-23 DIAGNOSIS — E119 Type 2 diabetes mellitus without complications: Secondary | ICD-10-CM

## 2021-01-23 DIAGNOSIS — E7849 Other hyperlipidemia: Secondary | ICD-10-CM

## 2021-01-23 MED ORDER — JARDIANCE 25 MG PO TABS: 25.0000 mg | ORAL_TABLET | Freq: Every day | ORAL | 2 refills | Status: AC

## 2021-01-23 NOTE — Telephone Encounter (Signed)
Billy Scott called clinic requesting refills for Jardiance & Lipitor to bridge him until his 03/15/2021 appt at [facility].   Turned 64 11/08/2020 & no longer on [place]'s health insurance. March is the earliest appt he could get at [facility] to establish a new PCP.  AMD

## 2021-03-04 ENCOUNTER — Other Ambulatory Visit: Payer: Self-pay | Admitting: Physician Assistant

## 2021-03-04 DIAGNOSIS — I10 Essential (primary) hypertension: Secondary | ICD-10-CM

## 2022-10-19 ENCOUNTER — Other Ambulatory Visit: Payer: Self-pay | Admitting: Family Medicine

## 2022-10-19 ENCOUNTER — Ambulatory Visit
Admission: RE | Admit: 2022-10-19 | Discharge: 2022-10-19 | Disposition: A | Discharge status: Home / Self Care | Payer: HMO | Source: Ambulatory Visit | Attending: Family Medicine | Admitting: Family Medicine

## 2022-10-19 DIAGNOSIS — S61432A Puncture wound without foreign body of left hand, initial encounter: Secondary | ICD-10-CM
